# Patient Record
Sex: Male | Born: 1965
Health system: Southern US, Community
[De-identification: ages and names within clinical notes are randomized; demographics above are authoritative.]

## PROBLEM LIST (undated history)

## (undated) DIAGNOSIS — F429 Obsessive-compulsive disorder, unspecified: Secondary | ICD-10-CM

## (undated) DIAGNOSIS — R011 Cardiac murmur, unspecified: Secondary | ICD-10-CM

## (undated) HISTORY — PX: GUM SURGERY: SHX658

## (undated) HISTORY — DX: Cardiac murmur, unspecified: R01.1

## (undated) HISTORY — DX: Obsessive-compulsive disorder, unspecified: F42.9

---

## 2004-07-19 ENCOUNTER — Encounter: Payer: Self-pay | Admitting: Family Medicine

## 2008-08-13 ENCOUNTER — Encounter: Payer: Self-pay | Admitting: Family Medicine

## 2008-08-13 LAB — CONVERTED CEMR LAB
ALT: 22 units/L
Creatinine, Ser: 0.9 mg/dL

## 2009-05-10 LAB — CONVERTED CEMR LAB
Basophils Relative: 0.8 %
Eosinophils Absolute: 0.1 10*3/uL
Eosinophils Relative: 1.6 %
HCT: 42 %
Hemoglobin: 14.8 g/dL
Lymphocytes Relative: 28.7 %
MCHC: 35.3 g/dL
MCV: 83.7 fL
Monocytes Absolute: 0.5 10*3/uL
Platelets: 267 10*3/uL
RDW: 12.2 %

## 2010-02-07 ENCOUNTER — Ambulatory Visit: Payer: Self-pay | Admitting: Family Medicine

## 2010-02-07 DIAGNOSIS — R011 Cardiac murmur, unspecified: Secondary | ICD-10-CM | POA: Insufficient documentation

## 2010-02-07 DIAGNOSIS — J301 Allergic rhinitis due to pollen: Secondary | ICD-10-CM

## 2010-02-07 DIAGNOSIS — F429 Obsessive-compulsive disorder, unspecified: Secondary | ICD-10-CM

## 2010-02-07 DIAGNOSIS — Z9189 Other specified personal risk factors, not elsewhere classified: Secondary | ICD-10-CM | POA: Insufficient documentation

## 2010-02-07 DIAGNOSIS — Z87448 Personal history of other diseases of urinary system: Secondary | ICD-10-CM | POA: Insufficient documentation

## 2010-02-07 DIAGNOSIS — M129 Arthropathy, unspecified: Secondary | ICD-10-CM | POA: Insufficient documentation

## 2010-02-16 ENCOUNTER — Encounter: Payer: Self-pay | Admitting: Family Medicine

## 2010-06-08 ENCOUNTER — Encounter: Payer: Self-pay | Admitting: Family Medicine

## 2010-06-13 ENCOUNTER — Encounter: Payer: Self-pay | Admitting: Family Medicine

## 2010-07-17 NOTE — Letter (Signed)
Summary: Michael Hull @ [facility] @ [employer]   Imported By: Lanelle Bal 02/26/2010 11:55:45  _____________________________________________________________________  External Attachment:    Type:   Image     Comment:   External Document

## 2010-07-17 NOTE — Miscellaneous (Signed)
Summary: Clinic update  Clinical Lists Changes  Observations: Added new observation of HGB: 14.5 g/dL (04/54/0981 19:14) Added new observation of SGPT (ALT): 22 units/L (08/13/2008 10:45) Added new observation of CREATININE: 0.90 mg/dL (78/29/5621 30:86) Clomipramine, Serum: 111 (09/22/2008) Norclomipramine,S: 210 (09/22/2008) Total (Clo+Norclo): 321 (09/22/2008) Clomipramine, Serum: 103 (05/10/2009) Norclomipramine,s: 154 (05/10/2009) Total (Clo+Norclo): 257 (05/10/2009)

## 2010-07-17 NOTE — Assessment & Plan Note (Signed)
Summary: TRANSFER FROM [facility]LL MED/CLE   Vital Signs:  Patient profile:   45 year old male Height:      68.5 inches Weight:      192.75 pounds BMI:     28.99 Temp:     98.3 degrees F oral Pulse rate:   92 / minute Pulse rhythm:   regular BP sitting:   108 / 80  (left arm) Cuff size:   large  Vitals Entered By: Delilah Shan CMA Evonna Stoltz Dull) (February 07, 2010 2:51 PM) CC: Transfer from [place]   History of Present Illness: OCD- symptoms became more prominent in 8th grade.  Contamination, repetition, checking.  In therapy with Dr. Ledon Snare.  Has been on current meds for extended period with control of symptoms.  Continues with behavioral treatment.  No SI/HI.  Looking for a job.  Doing well overall.   Preventive Screening-Counseling & Management  Alcohol-Tobacco     Smoking Status: never  Caffeine-Diet-Exercise     Does Patient Exercise: no      Drug Use:  no.    Allergies (verified): 1)  ! Penicillin 2)  ! Ampicillin  Past History:  Family History: Last updated: 02/07/2010 Family History of Arthritis, great uncle Family History Diabetes 1st degree relative, Dad is pre-diabetic Family History Hypertension, maternal uncle Family History of Heart Disease, granddad and other blood relative  Social History: Last updated: 02/07/2010 Occupation:  Currently, house parent, looking for CNA job Education:  BS Single Never Smoked Alcohol use-no Drug use-no Regular exercise-no  Past Medical History: OBSESSIVE-COMPULSIVE DISORDERS (ICD-300.3) HEMATURIA, HX OF (ICD-V13.09) SYSTOLIC MURMUR (ZOX-096.0) HAY FEVER (ICD-477.0) CHICKENPOX, HX OF (ICD-V15.9) ARTHRITIS (ICD-716.90) FAMILY HISTORY DIABETES 1ST DEGREE RELATIVE (ICD-V18.0) [facility] July 1986 for 1 week N. [facility] July - October 1986 [facility] for 1 week of testing - January 1987   Family History: Family History of Arthritis, great uncle Family History Diabetes 1st degree  relative, Dad is pre-diabetic Family History Hypertension, maternal uncle Family History of Heart Disease, granddad and other blood relative  Social History: Reviewed history and no changes required. Occupation:  Currently, house parent, looking for CNA job Education:  BS Single Never Smoked Alcohol use-no Drug use-no Regular exercise-no Occupation:  employed Smoking Status:  never Drug Use:  no Does Patient Exercise:  no  Review of Systems       See HPI.  Otherwise negative.    Physical Exam  General:  GEN: nad, alert and oriented HEENT: mucous membranes moist NECK: supple w/o LA CV: rrr.  no murmur PULM: ctab, no inc wob ABD: soft, +bs EXT: no edema SKIN: no acute rash    Impression & Recommendations:  Problem # 1:  OBSESSIVE-COMPULSIVE DISORDERS (ICD-300.3) Hard copy written for anafranil level, CBC, Cr, ALT; dx V58.53. No change in meds. Check labs in 12/11.  Fu in the spring.  continue with therapy with Dr. Ledon Snare.  Overall appears to be doing well.    Complete Medication List: 1)  Clomipramine Hcl 50 Mg Caps (Clomipramine hcl) .... Take 2 capsules by mouth two times a day 2)  Effexor Xr 75 Mg Xr24h-cap (Venlafaxine hcl) .... Take 1 capsule by mouth once a day  Patient Instructions: 1)  Keep your appointment with Dr. Ledon Snare in 02/2010.  I want you to have your labs checked again in 05/2010.  I would like to see you back in the spring of 2012. Let me know if you have questions or concerns in  the meantime.   Prescriptions: CLOMIPRAMINE HCL 50 MG CAPS (CLOMIPRAMINE HCL) Take 2 capsules by mouth two times a day  #360 x 3   Entered and Authorized by:   Crawford Givens MD   Signed by:   Crawford Givens MD on 02/07/2010   Method used:   Print then Give to Patient   RxID:   [id number]   Current Allergies (reviewed today): ! PENICILLIN ! AMPICILLIN

## 2010-07-19 LAB — CONVERTED CEMR LAB
ALT: 25 units/L
HCT: 39.8 %
Hemoglobin: 13.7 g/dL

## 2010-07-19 NOTE — Miscellaneous (Signed)
  Clinical Lists Changes  Observations: Added new observation of HCT: 39.8 % (06/08/2010 17:13) Added new observation of HGB: 13.7 g/dL (16/03/9603 54:09) Added new observation of RBC M/UL: 4.83 M/uL (06/08/2010 17:13) Added new observation of WBC COUNT: 5.4 10*3/microliter (06/08/2010 17:13) Added new observation of SGPT (ALT): 25 units/L (06/08/2010 17:13) Added new observation of CREATININE: 0.87 mg/dL (81/19/1478 29:56)

## 2010-08-06 ENCOUNTER — Encounter: Payer: Self-pay | Admitting: Family Medicine

## 2010-08-06 ENCOUNTER — Ambulatory Visit (INDEPENDENT_AMBULATORY_CARE_PROVIDER_SITE_OTHER): Payer: Self-pay | Admitting: Family Medicine

## 2010-08-06 DIAGNOSIS — F429 Obsessive-compulsive disorder, unspecified: Secondary | ICD-10-CM

## 2010-08-14 NOTE — Assessment & Plan Note (Signed)
Summary: REFILL MEDICATION/CLE   NO INSURANCE   Vital Signs:  Patient profile:   45 year old male Height:      68.5 inches Weight:      191 pounds BMI:     28.72 Temp:     97.7 degrees F oral Pulse rate:   80 / minute Pulse rhythm:   regular BP sitting:   120 / 78  (left arm) Cuff size:   large  Vitals Entered By: Delilah Shan CMA Michael Hull) (August 06, 2010 9:33 AM) CC: Refill medication   CC:  Refill medication.  History of Present Illness: Continues to work thought OCD thoughts.  No SI/HI.  Contracts for safety.  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  Still in counseling.  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  Overall, mood is controlled/good and 'middle of the road.'  Compliant with meds.    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  Sleeping well.  Some exercise, walking a lot at work.  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    Needs form done for effexor for med assistance.  We did these and gave originals back to the patient.   Allergies: 1)  ! Penicillin 2)  ! Ampicillin  Past History:  Past Medical History: Last updated: 02/07/2010 OBSESSIVE-COMPULSIVE DISORDERS (ICD-300.3) HEMATURIA, HX OF (ICD-V13.09) SYSTOLIC MURMUR (ZOX-096.0) HAY FEVER (ICD-477.0) CHICKENPOX, HX OF (ICD-V15.9) ARTHRITIS (ICD-716.90) FAMILY HISTORY DIABETES 1ST DEGREE RELATIVE (ICD-V18.0) [facility] July 1986 for 1 week N. [facility] July - October 1986 [facility] for 1 week of testing - January 1987   Review of Systems       See HPI.  Otherwise negative.    Physical Exam  General:  GEN: nad, alert and oriented HEENT: mucous membranes moist NECK: supple w/o LA CV: rrr.  no murmur PULM: ctab, no inc wob ABD: soft, +bs EXT: no edema SKIN: no acute rash    Impression & Recommendations:  Problem # 1:  OBSESSIVE-COMPULSIVE DISORDERS (ICD-300.3) No change in meds.  Controlled, continue with counseling.   Due to brevity of encounter, level 2 charged with 6 month follow up planned.    Complete Medication List: 1)  Clomipramine Hcl 50 Mg Caps (Clomipramine hcl) .... Take 2 capsules by mouth two times a day 2)  Effexor Xr 75 Mg Xr24h-cap (Venlafaxine hcl) .... Take 1 capsule by mouth once a day 3)  Multivitamins Tabs (Multiple vitamin) .... Take 1 tablet by mouth once a day  Patient Instructions: 1)  I would like to see you back in 6 months.  Let me know if you have concerns in the meantime.  Take care.  Prescriptions: EFFEXOR XR 75 MG XR24H-CAP (VENLAFAXINE HCL) Take 1 capsule by mouth once a day  #90 x 3   Entered and Authorized by:   Michael Givens MD   Signed by:   Michael Givens MD on 08/06/2010   Method used:   Print then Give to Patient   RxID:   [id number] EFFEXOR XR 75 MG XR24H-CAP (VENLAFAXINE HCL) Take 1 capsule by mouth once a day  #90 x 3   Entered and Authorized by:   Michael Givens MD   Signed by:   Michael Givens MD on 08/06/2010   Method used:   Print then Give to Patient   RxID:   [id number]    Orders Added: 1)  Est. Patient Level II [52841]  Current Allergies (reviewed today): ! PENICILLIN ! AMPICILLIN

## 2010-08-23 NOTE — Medication Information (Signed)
Summary: Effexor/[employer]  Effexor/[employer]   Imported By: Sherian Rein 08/13/2010 09:40:16  _____________________________________________________________________  External Attachment:    Type:   Image     Comment:   External Document

## 2010-09-20 ENCOUNTER — Telehealth: Payer: Self-pay | Admitting: *Deleted

## 2010-09-20 ENCOUNTER — Other Ambulatory Visit: Payer: Self-pay | Admitting: *Deleted

## 2010-09-20 NOTE — Telephone Encounter (Signed)
Created in error

## 2010-09-20 NOTE — Telephone Encounter (Signed)
Effexor XR received from [employer] patient assistance.  Lot number [id number], exp 02/2013.  Advised pt, he will pick up.

## 2010-12-12 ENCOUNTER — Other Ambulatory Visit: Payer: Self-pay | Admitting: *Deleted

## 2010-12-12 NOTE — Telephone Encounter (Signed)
Reorder for pt's effexor called to [facility], order number [id number].

## 2010-12-24 ENCOUNTER — Telehealth: Payer: Self-pay | Admitting: *Deleted

## 2010-12-24 NOTE — Telephone Encounter (Signed)
Pt's Effexor has arrived from [employer], left message on pt's voice mail asking him to call so that I can advise him.  One bottle of number 90, lot number [id number], expires 04/2013.

## 2010-12-25 NOTE — Telephone Encounter (Signed)
Pt was advised that effexor is here, he will pick up later in the week.

## 2011-01-14 ENCOUNTER — Encounter: Payer: Self-pay | Admitting: Family Medicine

## 2011-01-17 ENCOUNTER — Ambulatory Visit (INDEPENDENT_AMBULATORY_CARE_PROVIDER_SITE_OTHER): Payer: Self-pay | Admitting: Family Medicine

## 2011-01-17 ENCOUNTER — Encounter: Payer: Self-pay | Admitting: Family Medicine

## 2011-01-17 DIAGNOSIS — F429 Obsessive-compulsive disorder, unspecified: Secondary | ICD-10-CM

## 2011-01-17 MED ORDER — CLOMIPRAMINE HCL 50 MG PO CAPS: 100.0000 mg | ORAL_CAPSULE | Freq: Two times a day (BID) | ORAL | Status: DC

## 2011-01-17 NOTE — Patient Instructions (Signed)
Don't change your meds.  Take care.  Call me if you have concerns.  I want to see you back in 6 months.  OV.

## 2011-01-17 NOTE — Progress Notes (Signed)
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  Last eval was good.  "They said I was a little slow, but I do a good job."  He is busy with his work.    Mood has been "okay."  "In the middle."  Obsessions are occ present, and the compulsions can slow him down, but he is managing.  "I would work faster w/o the compulsions [checking]."  He checks the trash to make sure he "isn't throwing away something important."  He is meticulous with vitals.  Sleeping well.  No SI/HI.  He contracts for safety.    He's still seeing Dr. Ledon Snare for counseling.  They have a list of goals to work through.  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    Meds, vitals, and allergies reviewed.   ROS: See HPI.  Otherwise, noncontributory.  Faint tremor.   nad ncat rrr ctab abd soft, ext w/o edema Speech and judgement intact

## 2011-01-17 NOTE — Assessment & Plan Note (Signed)
Controlled, continue current meds.  Okay for outpatient f/u.  Continue with counseling.

## 2011-01-23 ENCOUNTER — Telehealth: Payer: Self-pay | Admitting: Family Medicine

## 2011-01-23 NOTE — Telephone Encounter (Signed)
I called Dr. Jennette Banker clinic about the patient, just to check on pt's care plan.  I LMOVM, will await return call.

## 2011-01-24 ENCOUNTER — Telehealth: Payer: Self-pay | Admitting: *Deleted

## 2011-01-24 NOTE — Telephone Encounter (Signed)
Noted. Thanks.

## 2011-01-24 NOTE — Telephone Encounter (Signed)
Dr. Ledon Snare called to let you know that he agrees with your opinion on patient- says he is steady, stable, good.

## 2011-03-18 ENCOUNTER — Telehealth: Payer: Self-pay | Admitting: *Deleted

## 2011-03-18 NOTE — Telephone Encounter (Signed)
Refill order for effexor called to [employer], order number [id number].

## 2011-03-25 ENCOUNTER — Telehealth: Payer: Self-pay | Admitting: *Deleted

## 2011-03-25 NOTE — Telephone Encounter (Signed)
Effexor received from [employer], one bottle of # 90.  Lot [id number], expires 05/2013. Left message advising pt ok to pick up.

## 2011-06-14 ENCOUNTER — Other Ambulatory Visit: Payer: Self-pay | Admitting: Internal Medicine

## 2011-06-14 NOTE — Telephone Encounter (Signed)
[employer] and reordered his Effexor.  Order nu# is [id number]

## 2011-07-22 ENCOUNTER — Encounter: Payer: Self-pay | Admitting: Family Medicine

## 2011-07-22 ENCOUNTER — Ambulatory Visit (INDEPENDENT_AMBULATORY_CARE_PROVIDER_SITE_OTHER): Payer: Self-pay | Admitting: Family Medicine

## 2011-07-22 DIAGNOSIS — Z8349 Family history of other endocrine, nutritional and metabolic diseases: Secondary | ICD-10-CM

## 2011-07-22 DIAGNOSIS — Z13220 Encounter for screening for lipoid disorders: Secondary | ICD-10-CM | POA: Insufficient documentation

## 2011-07-22 DIAGNOSIS — F429 Obsessive-compulsive disorder, unspecified: Secondary | ICD-10-CM

## 2011-07-22 DIAGNOSIS — Z79899 Other long term (current) drug therapy: Secondary | ICD-10-CM | POA: Insufficient documentation

## 2011-07-22 NOTE — Patient Instructions (Addendum)
Send me the form about the effexor and I'll fill it out.   Get fasting labs done.  Take care.  Recheck in 6 months.

## 2011-07-22 NOTE — Progress Notes (Signed)
OCD.  There was a norovirus outbreak at his work, but he isn't sick from that. 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  Still active in church.  He isn't taking classes this semester.  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  He looks forward to tutoring.  He is exercising, but "not as much as I should be."  He did a 5K in October.    Affect is bright today.  Mood is good. "I can get discouraged some, but generally okay."  He hasn't seen Dr. Ledon Snare recently, but is planning on following up at [facility] eventually.  He'll see Dr. Ledon Snare if he can't get in at [facility] soon.    His obsessions- cleanliness- and compulsions- checking- continues but this is manageable.  No Si/Hi.  He contracts for safety.  Compliant with meds.    Screening for lipid disorders, due for labs.    FH thyroid disease, never screened.  No mass in neck.    PMH and SH reviewed  ROS: See HPI, otherwise noncontributory.  Meds, vitals, and allergies reviewed.    nad ncat Mmm No neck mass rrr ctab abd soft, not ttp Ext w/o edema Speech and judgement wnl, affect bright

## 2011-07-23 NOTE — Assessment & Plan Note (Signed)
Order for lipid panel written

## 2011-07-23 NOTE — Assessment & Plan Note (Signed)
Continue current meds, order for anafranil level, ALT written and he'll get drawn.  Okay for outpatient f/u.  He'll f/u with counseling and/or [facility] clinic.

## 2011-07-23 NOTE — Assessment & Plan Note (Signed)
Order for [facility] written.  He'll get drawn.

## 2011-08-29 ENCOUNTER — Other Ambulatory Visit: Payer: Self-pay | Admitting: *Deleted

## 2011-08-29 NOTE — Telephone Encounter (Signed)
Called [employer] [facility] at [phone] Pt should received medication in 7-10 days  effexor xr 75 mg Order number is [id number]

## 2011-09-03 ENCOUNTER — Telehealth: Payer: Self-pay | Admitting: *Deleted

## 2011-09-03 NOTE — Telephone Encounter (Signed)
Patient called and wanted to know when is he suppose to take the last dose of Clomipramine?  12 hours before blood draw or when?  Please advise.

## 2011-09-03 NOTE — Telephone Encounter (Signed)
12 hours before the draw.  If he has an AM lab draw, I would just take the AM dose after sample collection.

## 2011-09-03 NOTE — Telephone Encounter (Signed)
Patient advised.

## 2011-09-04 ENCOUNTER — Telehealth: Payer: Self-pay | Admitting: *Deleted

## 2011-09-04 NOTE — Telephone Encounter (Signed)
Pt wants an order to have comipramine level checked.  He wants the order faxed to [facility] at fax number [phone], and he would like to be notified when this is faxed.

## 2011-09-04 NOTE — Telephone Encounter (Signed)
Please fax.  Hard copy is in my office.  Notify pt.

## 2011-09-05 NOTE — Telephone Encounter (Signed)
Order faxed as requested  Patient notified

## 2011-09-11 ENCOUNTER — Encounter: Payer: Self-pay | Admitting: Family Medicine

## 2011-09-18 ENCOUNTER — Other Ambulatory Visit: Payer: Self-pay

## 2011-09-18 NOTE — Telephone Encounter (Signed)
Pt came by office and left message that he checked website [employer] for prescription assistance and pt is no longer eligible for free Effexor.  Pt said when need refill in June he will call to get new rx for Venlafaxine. Spoke with pt and advised when about 1-2 weeks before need rx to request prescription. Pt confirmed that.

## 2011-11-05 ENCOUNTER — Telehealth: Payer: Self-pay

## 2011-11-05 NOTE — Telephone Encounter (Signed)
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 Pt wanted to know if should get Immunizations including MMR prior to coming to office and bring immunization record. I told pt he does need to bring school form and any mmunization records he has. If pt needs MMR would need to get at health dept because we do not have adult MMR vaccination in our office. Pt acknowledged understanding.

## 2011-12-02 ENCOUNTER — Encounter: Payer: Self-pay | Admitting: Family Medicine

## 2011-12-02 ENCOUNTER — Ambulatory Visit (INDEPENDENT_AMBULATORY_CARE_PROVIDER_SITE_OTHER): Payer: Self-pay | Admitting: Family Medicine

## 2011-12-02 VITALS — BP 118/72 | HR 91 | Temp 98.5°F | Ht 68.5 in | Wt 195.0 lb

## 2011-12-02 DIAGNOSIS — R7989 Other specified abnormal findings of blood chemistry: Secondary | ICD-10-CM

## 2011-12-02 DIAGNOSIS — IMO0001 Reserved for inherently not codable concepts without codable children: Secondary | ICD-10-CM

## 2011-12-02 DIAGNOSIS — Z8349 Family history of other endocrine, nutritional and metabolic diseases: Secondary | ICD-10-CM

## 2011-12-02 DIAGNOSIS — F429 Obsessive-compulsive disorder, unspecified: Secondary | ICD-10-CM

## 2011-12-02 DIAGNOSIS — R61 Generalized hyperhidrosis: Secondary | ICD-10-CM

## 2011-12-02 DIAGNOSIS — Z23 Encounter for immunization: Secondary | ICD-10-CM

## 2011-12-02 DIAGNOSIS — Z8619 Personal history of other infectious and parasitic diseases: Secondary | ICD-10-CM

## 2011-12-02 DIAGNOSIS — R6889 Other general symptoms and signs: Secondary | ICD-10-CM

## 2011-12-02 DIAGNOSIS — Z029 Encounter for administrative examinations, unspecified: Secondary | ICD-10-CM

## 2011-12-02 LAB — TSH: TSH: 5.21 u[IU]/mL (ref 0.35–5.50)

## 2011-12-02 MED ORDER — VENLAFAXINE HCL ER 75 MG PO CP24: 75.0000 mg | ORAL_CAPSULE | Freq: Every day | ORAL | Status: DC

## 2011-12-02 NOTE — Progress Notes (Signed)
Needs physical form done for school.  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  See scanned forms.   Noted history includes OCD.  Treated with effect and doing well in terms of daily function and symptom control.  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  No Si/Hi.  No recent med changes.  Compliant with meds.    He does have excessive sweating on back and chest.  Can soak through his shirt.  No a new symptom.  Has cut out caffeine.  It is worse on a stressful day.  Asking about options.    H/o mildly abnormal TSH and due for recheck.  T3/T4 wnl prev.  No goiter.    Meds, vitals, and allergies reviewed.   ROS: See HPI.  Otherwise, noncontributory.  nad Affect wnl, speech wnl and fluent.  Judgement intact Mmm rrr ctab Abd soft, not ttp Ext w/o edema See scanned forms.

## 2011-12-02 NOTE — Patient Instructions (Addendum)
Go to the lab on the way out.  We'll contact you with your lab report. Come back in 48-72 hours for a PPD check.  I'll finish your forms at that point.   I would get dry-sol over the counter and see if that helps.   Take care.

## 2011-12-03 ENCOUNTER — Encounter: Payer: Self-pay | Admitting: Family Medicine

## 2011-12-03 ENCOUNTER — Encounter: Payer: Self-pay | Admitting: *Deleted

## 2011-12-03 DIAGNOSIS — R61 Generalized hyperhidrosis: Secondary | ICD-10-CM | POA: Insufficient documentation

## 2011-12-03 DIAGNOSIS — Z029 Encounter for administrative examinations, unspecified: Secondary | ICD-10-CM | POA: Insufficient documentation

## 2011-12-03 LAB — VARICELLA ZOSTER ANTIBODY, IGG: Varicella IgG: 5.03 {ISR} — ABNORMAL HIGH

## 2011-12-03 NOTE — Assessment & Plan Note (Signed)
With minimal elevation in TSH.  No TMG on exam, this could be rechecked episodically.

## 2011-12-03 NOTE — Assessment & Plan Note (Signed)
With good med adherence, no Si/Hi, and sx controlled.  He is looking forward to school and it appears that he has no contraindication to applying and proceeding.  See scanned forms.

## 2011-12-03 NOTE — Assessment & Plan Note (Signed)
Needed PPD placed, will return for reading.  Due for meningococcal and Tdap today, done.  Vision and hear screen passed and varicella titer drawn.  See scanned forms.

## 2011-12-03 NOTE — Assessment & Plan Note (Signed)
He can use drysol topically and f/u prn.

## 2011-12-04 ENCOUNTER — Telehealth: Payer: Self-pay

## 2011-12-04 ENCOUNTER — Ambulatory Visit: Payer: Self-pay

## 2011-12-04 LAB — TB SKIN TEST: TB Skin Test: NEGATIVE

## 2011-12-04 MED ORDER — ALUMINUM CHLORIDE 20 % EX SOLN: Freq: Every day | CUTANEOUS | Status: DC

## 2011-12-04 NOTE — Telephone Encounter (Signed)
Pt said Drysol is not OTC and request rx sent to [facility] [address].Please advise.

## 2011-12-04 NOTE — Telephone Encounter (Signed)
Sent!

## 2011-12-06 ENCOUNTER — Encounter: Payer: Self-pay | Admitting: *Deleted

## 2012-03-17 ENCOUNTER — Other Ambulatory Visit: Payer: Self-pay | Admitting: Family Medicine

## 2012-03-17 NOTE — Telephone Encounter (Signed)
Electronic refill request.  Please advise. 

## 2012-03-18 NOTE — Telephone Encounter (Signed)
Sent!

## 2012-03-28 ENCOUNTER — Other Ambulatory Visit: Payer: Self-pay | Admitting: Family Medicine

## 2012-03-30 ENCOUNTER — Other Ambulatory Visit: Payer: Self-pay

## 2012-03-30 NOTE — Telephone Encounter (Signed)
Electronic refill request

## 2012-03-30 NOTE — Telephone Encounter (Signed)
Pt left v/m requesting refill clomipramine to [facility] [place].Please advise.

## 2012-03-31 MED ORDER — CLOMIPRAMINE HCL 50 MG PO CAPS: 100.0000 mg | ORAL_CAPSULE | Freq: Two times a day (BID) | ORAL | Status: DC

## 2012-03-31 NOTE — Telephone Encounter (Signed)
Sent!

## 2012-04-02 ENCOUNTER — Other Ambulatory Visit: Payer: Self-pay | Admitting: *Deleted

## 2012-04-02 NOTE — Telephone Encounter (Signed)
Verify the clomipramine cost with pharmacy.  This generic should not be anywhere near that expensive.  Thanks.

## 2012-04-02 NOTE — Telephone Encounter (Signed)
Pharmacy verified the cost.

## 2012-04-02 NOTE — Telephone Encounter (Signed)
Pt called back; clomipramine cost $4000 for 3 month supply and with savings card 3 month cost $2790. Too expensive and pt request increase in Venlafaxine.Please advise. [facility] [address] [place].

## 2012-04-02 NOTE — Telephone Encounter (Signed)
Pt came in stating his rx (clomipramine) was not called in to pharmacy. Rx was approved on 10/14/213 and I called it in.

## 2012-04-03 NOTE — Telephone Encounter (Signed)
Notify pt. I'll need to d/w pharmacy about alternatives.  I'll let him know about options as soon as I can.

## 2012-04-03 NOTE — Telephone Encounter (Signed)
I phoned [facility] to get a price check and there wasn't much difference... but some, $3,219 for a 3 month supply without insurance.  If his insurance pays the same amount as it did on the $4000 tab, it would turn out to be about $1210 for a 3 month supply.  I don't think it will be any cheaper anywhere than [facility].

## 2012-04-06 MED ORDER — CLOMIPRAMINE HCL 50 MG PO CAPS: 100.0000 mg | ORAL_CAPSULE | Freq: Two times a day (BID) | ORAL | Status: DC

## 2012-04-06 NOTE — Telephone Encounter (Signed)
LMOVM in detail.  Faxed Rx to number provided below.

## 2012-04-06 NOTE — Telephone Encounter (Signed)
I called pt.  He has no SI/HI.  He has been off clomipramine for about 1 week.  He has no w/d symptoms.  Mood is good.  He has mild inc in OCD symptoms, but no SI/HI.  At this point, would inc the venlafaxine to 150 mg a day until clomipramine rx arrives in a few days (hopefully delivery in about 5-7 business days).  At that point, cut back to venlafaxine 75mg .  Slowly restart clomipramine, 50mg /day for 1 week, 100mg /day for 1 week,  150mg /day for 1 week, 200mg /day for 1 week.  He agrees and will notify me if mood/OCD changes.  Contracts for safety. He'll notify me before he runs out of medicine next time.

## 2012-04-06 NOTE — Telephone Encounter (Addendum)
Pt left v/m requesting new prescription for  # 300 or # 400 for clomipramine 50 mg taking 2 tabs in AM and 2 tabs in PM. Pt request rx faxed to [facility] out of [place] and [place]; fax #[phone] or phone # [phone]. Order # [id number]. Pt also request increase in Venlafaxine from 75 mg daily to 150 mg daily. Pt said if does not hear from someone by 24 hours pt will assume OK to increase Venlafaxine. Please call pt back ASAP.Please advise.

## 2012-04-06 NOTE — Telephone Encounter (Signed)
Pt left v/m that [facility] did get rx for clomipramine but will take 2-3 weeks for pt to receive med and wants to know what should do while waiting on clomipramine. Pt understood he is not to increase venlafaxine. Pt has never taken Paxil.Please advise.

## 2012-04-06 NOTE — Addendum Note (Signed)
Addended by: Joaquim Nam on: 04/06/2012 09:40 AM   Modules accepted: Orders

## 2012-04-06 NOTE — Telephone Encounter (Signed)
I had checked with pharmacy about this over the weekend.  Would be reasonable to change to paxil if he can't get the clomipramine.  I wouldn't change the venlafaxine at this point.  I printed the clomipramine rx; please fax this in.  If he can get this, then I wouldn't change any of his meds.  Thanks.

## 2012-11-04 ENCOUNTER — Telehealth: Payer: Self-pay

## 2012-11-04 NOTE — Telephone Encounter (Signed)
Needs f/u visit this summer, no labs ahead of time.

## 2012-11-04 NOTE — Telephone Encounter (Signed)
Pt left v/m usually has appt to see Dr Para March q 6 months. Last seen 11/2011. When does Dr Para March want to see pt and does pt needs labs prior to appt.Please advise.

## 2012-11-05 NOTE — Telephone Encounter (Signed)
Left detailed message on voicemail.  

## 2013-01-21 ENCOUNTER — Encounter: Payer: Self-pay | Admitting: Family Medicine

## 2013-01-21 ENCOUNTER — Ambulatory Visit (INDEPENDENT_AMBULATORY_CARE_PROVIDER_SITE_OTHER): Payer: BC Managed Care – PPO | Admitting: Family Medicine

## 2013-01-21 VITALS — BP 132/84 | HR 92 | Temp 98.1°F | Wt 195.2 lb

## 2013-01-21 DIAGNOSIS — F429 Obsessive-compulsive disorder, unspecified: Secondary | ICD-10-CM

## 2013-01-21 MED ORDER — CLOMIPRAMINE HCL 50 MG PO CAPS: 100.0000 mg | ORAL_CAPSULE | Freq: Two times a day (BID) | ORAL | Status: DC

## 2013-01-21 MED ORDER — ALUMINUM CHLORIDE 20 % EX SOLN: CUTANEOUS | Status: DC

## 2013-01-21 MED ORDER — VENLAFAXINE HCL ER 75 MG PO CP24: ORAL_CAPSULE | ORAL | Status: DC

## 2013-01-21 NOTE — Patient Instructions (Signed)
Go to the lab on the way out.  We'll contact you with your lab report.  Take care.  Recheck in about 6-8 months.

## 2013-01-21 NOTE — Progress Notes (Signed)
He was able to get clomipramine and is back on that now in addition to the venlafaxine.  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  He is looking for a job now.  He has been working as a Firefighter in the meantime.  Still living in [place], renting a room.  Still with frequent caffeine.  Discussed his mood, "okay, middle of the road."  He thinks the anxiety improved after getting out of the nursing program, "it was a burden lifted off."  He is going to see Dr. Ledon Snare with counseling.  He is trying to get closer to home, ie in the [facility] clinic.  He has a list of options from [facility].  He is sleeping a little more than typical, he is slightly less motivated.  He doesn't give a history of depressive sx.  No SI/HI.  He contracts for safety.  D/w pt about rituals, ie laundry.  He is still trying to work through his goals, prev d/w Dr. Ledon Snare.  This helps with compulsions.   Drysol helps with excessive sweating.    Meds, vitals, and allergies reviewed.   ROS: See HPI.  Otherwise, noncontributory.  GEN: nad, alert and oriented HEENT: mucous membranes moist NECK: supple w/o LA CV: rrr PULM: ctab, no inc wob ABD: soft, +bs EXT: no edema SKIN: no acute rash Speech and judgement wnl, no tremor

## 2013-01-22 NOTE — Assessment & Plan Note (Signed)
Mood stable, continue current meds. Check clomipramine level today.  He agrees. No changes in in meds o/w. He'll continue with counseling and goal directed behavior. Okay for outpatient f/u.

## 2013-02-03 ENCOUNTER — Encounter: Payer: Self-pay | Admitting: Family Medicine

## 2013-04-22 ENCOUNTER — Other Ambulatory Visit: Payer: Self-pay

## 2013-10-21 ENCOUNTER — Encounter: Payer: Self-pay | Admitting: Family Medicine

## 2013-10-21 ENCOUNTER — Ambulatory Visit (INDEPENDENT_AMBULATORY_CARE_PROVIDER_SITE_OTHER): Payer: BC Managed Care – PPO | Admitting: Family Medicine

## 2013-10-21 VITALS — BP 124/72 | HR 82 | Temp 98.0°F | Ht 68.0 in | Wt 196.0 lb

## 2013-10-21 DIAGNOSIS — Z8349 Family history of other endocrine, nutritional and metabolic diseases: Secondary | ICD-10-CM

## 2013-10-21 DIAGNOSIS — Z0000 Encounter for general adult medical examination without abnormal findings: Secondary | ICD-10-CM

## 2013-10-21 DIAGNOSIS — E7800 Pure hypercholesterolemia, unspecified: Secondary | ICD-10-CM

## 2013-10-21 DIAGNOSIS — F429 Obsessive-compulsive disorder, unspecified: Secondary | ICD-10-CM

## 2013-10-21 DIAGNOSIS — M549 Dorsalgia, unspecified: Secondary | ICD-10-CM

## 2013-10-21 MED ORDER — CLOMIPRAMINE HCL 50 MG PO CAPS: 100.0000 mg | ORAL_CAPSULE | Freq: Two times a day (BID) | ORAL | Status: DC

## 2013-10-21 MED ORDER — ALUMINUM CHLORIDE 20 % EX SOLN: CUTANEOUS | Status: DC

## 2013-10-21 MED ORDER — VENLAFAXINE HCL ER 75 MG PO CP24: ORAL_CAPSULE | ORAL | Status: DC

## 2013-10-21 NOTE — Progress Notes (Signed)
Pre visit review using our clinic review tool, if applicable. No additional management support is needed unless otherwise documented below in the visit note.  CPE- See plan.  Routine anticipatory guidance given to patient.  See health maintenance. Tetanus 2013 Flu shot done at work Prostate and colon cancer screening not due.  Diet and exercise d/w pt.  He is trying to exercise more.  Diet is fair.  D/w pt.   Living will.  Would have his sister designated if patient were incapacitated.    FH thyroid disease.  Prev with TSH wnl.  No neck mass or lumps.  No dysphagia.    Some back pain with lifting.  R lower back.  No rash.  No trauma. No leg sx.   OCD.  D/w pt.  Compliant with meds.  Still focused on being thorough at work which can potentially be helpful for the patients individually but he isn't able to push through quickly.  We talked about making this manageable, coping strategies. He sees a clear benefit from the meds, no ADE.  Mood is "in the middle."  No SI/HI.    PMH and SH reviewed  Meds, vitals, and allergies reviewed.   ROS: See HPI.  Otherwise negative.    GEN: nad, alert and oriented HEENT: mucous membranes moist NECK: supple w/o LA, no TMG CV: rrr. PULM: ctab, no inc wob ABD: soft, +bs EXT: no edema SKIN: no acute rash Mood and speech appear wnl R lower back ttp but not ttp in midline.  Gait wnl.

## 2013-10-21 NOTE — Patient Instructions (Addendum)
Go to the lab on the way out.  We'll contact you with your lab report. I would get a flu shot each fall.   Stretch your lower back and take care. Glad to see you.

## 2013-10-22 DIAGNOSIS — Z0000 Encounter for general adult medical examination without abnormal findings: Secondary | ICD-10-CM | POA: Insufficient documentation

## 2013-10-22 DIAGNOSIS — M549 Dorsalgia, unspecified: Secondary | ICD-10-CM | POA: Insufficient documentation

## 2013-10-22 LAB — COMPREHENSIVE METABOLIC PANEL
ALT: 18 U/L (ref 0–53)
AST: 26 U/L (ref 0–37)
Albumin: 4.4 g/dL (ref 3.5–5.2)
Alkaline Phosphatase: 89 U/L (ref 39–117)
BUN: 15 mg/dL (ref 6–23)
CALCIUM: 9.2 mg/dL (ref 8.4–10.5)
CHLORIDE: 104 meq/L (ref 96–112)
CO2: 27 meq/L (ref 19–32)
Creatinine, Ser: 0.8 mg/dL (ref 0.4–1.5)
GFR: 106.19 mL/min (ref >60.00)
GLUCOSE: 79 mg/dL (ref 70–99)
POTASSIUM: 4.2 meq/L (ref 3.5–5.1)
Sodium: 137 mEq/L (ref 135–145)
Total Bilirubin: 0.7 mg/dL (ref 0.2–1.2)
Total Protein: 7.3 g/dL (ref 6.0–8.3)

## 2013-10-22 LAB — LIPID PANEL
CHOL/HDL RATIO: 5
CHOLESTEROL: 191 mg/dL (ref 0–200)
HDL: 40.6 mg/dL (ref >39.00)
LDL Cholesterol: 127 mg/dL — ABNORMAL HIGH (ref 0–99)
TRIGLYCERIDES: 115 mg/dL (ref 0.0–149.0)
VLDL: 23 mg/dL (ref 0.0–40.0)

## 2013-10-22 LAB — TSH: TSH: 1.85 u[IU]/mL (ref 0.35–4.50)

## 2013-10-22 NOTE — Assessment & Plan Note (Signed)
Continue as is with current meds.  Doing well overall, ie compensated.  No SI/HI.  Okay for outpatient f/u.

## 2013-10-22 NOTE — Assessment & Plan Note (Signed)
Likely strain d/w pt about posture for lifting and stretching.  Fu prn.

## 2013-10-22 NOTE — Assessment & Plan Note (Signed)
Check TSH today

## 2013-10-22 NOTE — Assessment & Plan Note (Signed)
Routine anticipatory guidance given to patient. See health maintenance.  Tetanus 2013  Flu shot done at work  Prostate and colon cancer screening not due.  Diet and exercise d/w pt. He is trying to exercise more. Diet is fair. D/w pt.  Living will. Would have his sister designated if patient were incapacitated.

## 2013-11-05 ENCOUNTER — Encounter: Payer: Self-pay | Admitting: *Deleted

## 2014-06-02 ENCOUNTER — Other Ambulatory Visit: Payer: Self-pay | Admitting: Family Medicine

## 2014-08-29 ENCOUNTER — Other Ambulatory Visit: Payer: Self-pay | Admitting: *Deleted

## 2014-08-29 MED ORDER — ALUMINUM CHLORIDE 20 % EX SOLN: CUTANEOUS | Status: DC

## 2014-08-29 MED ORDER — VENLAFAXINE HCL ER 75 MG PO CP24: 75.0000 mg | ORAL_CAPSULE | Freq: Every day | ORAL | Status: DC

## 2014-08-29 NOTE — Telephone Encounter (Signed)
Faxed refill request.  Patient is due for CPE in May.  No appt scheduled.  Please advise how many refills with reminder to schedule CPE. Drysol Last Filled:    60 mL 12 10/21/2013  Venlafaxine Last Filled:    90 capsule 1 06/02/2014

## 2014-08-29 NOTE — Telephone Encounter (Signed)
Sent.  Please schedule CPE.  Thanks.  

## 2014-08-30 NOTE — Telephone Encounter (Signed)
Patient advised.

## 2014-11-08 ENCOUNTER — Encounter: Payer: Self-pay | Admitting: Family Medicine

## 2014-11-08 ENCOUNTER — Other Ambulatory Visit: Payer: Self-pay | Admitting: Family Medicine

## 2014-11-08 ENCOUNTER — Ambulatory Visit (INDEPENDENT_AMBULATORY_CARE_PROVIDER_SITE_OTHER): Payer: 59 | Admitting: Family Medicine

## 2014-11-08 VITALS — BP 114/74 | HR 82 | Temp 98.2°F | Ht 67.5 in | Wt 194.5 lb

## 2014-11-08 DIAGNOSIS — Z0000 Encounter for general adult medical examination without abnormal findings: Secondary | ICD-10-CM

## 2014-11-08 DIAGNOSIS — F429 Obsessive-compulsive disorder, unspecified: Secondary | ICD-10-CM

## 2014-11-08 DIAGNOSIS — Z7189 Other specified counseling: Secondary | ICD-10-CM

## 2014-11-08 DIAGNOSIS — F42 Obsessive-compulsive disorder: Secondary | ICD-10-CM | POA: Diagnosis not present

## 2014-11-08 NOTE — Patient Instructions (Signed)
Go to the lab on the way out.  We'll contact you with your lab report. Let us know about 3 weeks before you need a prescription.   We can fax out the order.  Let us know where it need to go.

## 2014-11-08 NOTE — Progress Notes (Signed)
Pre visit review using our clinic review tool, if applicable. No additional management support is needed unless otherwise documented below in the visit note.  CPE- See plan.  Routine anticipatory guidance given to patient.  See health maintenance. Tetanus 2013 Flu shot done at work usually, encouraged.   Prostate and colon cancer screening not due.  Diet and exercise d/w pt. He is trying to exercise more. Diet is fair. D/w pt.  Encouraged both.   Living will. Would have his sister designated if patient were incapacitated.   OCD. D/w pt. Compliant with meds. Still focused on being thorough at work which can potentially be helpful for the patients individually but he isn't able to push through quickly. "I have a reputation for being slow, but that is partly from being conscientious.  We talked about making this manageable, coping strategies.  He sees a clear benefit from the meds, no ADE. Mood is "in the middle of the road." No SI/HI. He isn't in therapy now, it helped him some previous.  I encouraged counseling.  He has job application to go in soon.    PMH and SH reviewed  Meds, vitals, and allergies reviewed.   ROS: See HPI.  Otherwise negative.    GEN: nad, alert and oriented HEENT: mucous membranes moist NECK: supple w/o LA CV: rrr. PULM: ctab, no inc wob ABD: soft, +bs EXT: no edema SKIN: no acute rash Speech and affect wnl, at baseline.  Doesn't appear anxious.  He did check once (only once) to make sure he had all of his papers, etc when leaving the exam room.  No tremor.

## 2014-11-09 LAB — BASIC METABOLIC PANEL
BUN: 12 mg/dL (ref 6–23)
CALCIUM: 9.6 mg/dL (ref 8.4–10.5)
CHLORIDE: 101 meq/L (ref 96–112)
CO2: 28 meq/L (ref 19–32)
CREATININE: 0.91 mg/dL (ref 0.40–1.50)
GFR: 95.05 mL/min (ref >60.00)
Glucose, Bld: 92 mg/dL (ref 70–99)
POTASSIUM: 4.6 meq/L (ref 3.5–5.1)
Sodium: 135 mEq/L (ref 135–145)

## 2014-11-10 NOTE — Assessment & Plan Note (Signed)
Routine anticipatory guidance given to patient. See health maintenance.  Tetanus 2013  Flu shot done at work usually, encouraged.  Prostate and colon cancer screening not due.  Diet and exercise d/w pt. He is trying to exercise more. Diet is fair. D/w pt. Encouraged both.  Living will. Would have his sister designated if patient were incapacitated.  See notes on labs. Prev with lipids acceptable.

## 2014-11-10 NOTE — Assessment & Plan Note (Signed)
Compliant with meds. Still focused on being thorough at work which can potentially be helpful for the patients individually but he isn't able to push through quickly. "I have a reputation for being slow, but that is partly from being conscientious." We talked about making this manageable, coping strategies. He sees a clear benefit from the meds, no ADE. Mood is "in the middle of the road." No SI/HI. He isn't in therapy now, it helped him some previous. I encouraged counseling. He has a job application to go in soon. Okay for outpatient f/u.

## 2014-11-15 LAB — CLOMIPRAMINE
Clomipramine + Desmethylclompi: 265 mcg/L (ref 200–600)
Clomipramine.: 127 mcg/L (ref 50–250)
Desmethylclomipramine: 138 mcg/L — ABNORMAL LOW (ref 150–350)

## 2015-01-13 ENCOUNTER — Other Ambulatory Visit: Payer: Self-pay | Admitting: Family Medicine

## 2015-05-10 ENCOUNTER — Telehealth: Payer: Self-pay | Admitting: Family Medicine

## 2015-05-10 NOTE — Telephone Encounter (Signed)
Pt called stating he needed a updated lipid panel for insurance and asking if Dr. Para Marchuncan can do that order for him. Also he lives in [place] and if there is anywhere close by he might be able to go to instead of driving to the office.  Pt also wanted to check and make sure Dr. Para Marchuncan received his refill request.  [phone]

## 2015-05-10 NOTE — Telephone Encounter (Signed)
Spoke to patient and was advised that he needs a written script for Clomipramine and he mails it to a pharmacy in [place]. Patient also stated that he needs a written lab order and he can go to [facility] and get it drawn. Confirmed patient's mailing address. Advised patient that it may take a few days for him to get the script and lab order because of the office being closed for the holiday and weekend.

## 2015-05-14 MED ORDER — CLOMIPRAMINE HCL 50 MG PO CAPS: 100.0000 mg | ORAL_CAPSULE | Freq: Two times a day (BID) | ORAL | Status: DC

## 2015-05-14 NOTE — Telephone Encounter (Signed)
rx printed.  Please print letter for patient for lipid panel, dx Z13.220.  Thanks.

## 2015-05-15 ENCOUNTER — Encounter: Payer: Self-pay | Admitting: *Deleted

## 2015-05-15 NOTE — Telephone Encounter (Signed)
Order printed and mailed along with Clomipramine Rx.

## 2015-05-24 ENCOUNTER — Other Ambulatory Visit: Payer: Self-pay | Admitting: Family Medicine

## 2015-05-25 LAB — LIPID PANEL WITH LDL/HDL RATIO
Cholesterol, Total: 189 mg/dL (ref 100–199)
HDL: 42 mg/dL (ref >39)
LDL CALC: 113 mg/dL — AB (ref 0–99)
LDl/HDL Ratio: 2.7 ratio units (ref 0.0–3.6)
Triglycerides: 171 mg/dL — ABNORMAL HIGH (ref 0–149)
VLDL CHOLESTEROL CAL: 34 mg/dL (ref 5–40)

## 2015-05-26 ENCOUNTER — Encounter: Payer: Self-pay | Admitting: *Deleted

## 2015-06-14 ENCOUNTER — Telehealth: Payer: Self-pay

## 2015-06-14 NOTE — Telephone Encounter (Signed)
I recall him needing labs done and I recall arranging those orders. I don't recall a specific biometric form.   I don't have any outstanding papers from 2 weeks ago.  If he'll send me a copy, then I'll work on it.  Thanks .

## 2015-06-14 NOTE — Telephone Encounter (Signed)
Pt left v/m; pt mailed a biometric form to Dr Para Marchuncan 2 weeks ago and pt wants to know if this [facility], the fax # was on the form. Pt request cb. Do not see any info under media tab.

## 2015-06-15 NOTE — Telephone Encounter (Signed)
Patient advised.   Patient will try to get it faxed to us today or tomorrow and would appreciate a rapid turn-around of closing time Friday afternoon if at all possible.

## 2015-06-15 NOTE — Telephone Encounter (Signed)
Dr. Para Marchuncan located the biometric form.  Patient advised there is no need to fax the form to us.  Dr. Para Marchuncan will complete it and we will fax it in by 06/16/15.

## 2015-06-16 NOTE — Telephone Encounter (Signed)
Form done, please send in.  Thanks.  

## 2015-06-16 NOTE — Telephone Encounter (Signed)
Faxed form and sent for scanning. 

## 2015-11-17 ENCOUNTER — Other Ambulatory Visit: Payer: Self-pay | Admitting: Family Medicine

## 2015-11-17 NOTE — Telephone Encounter (Signed)
Electronic refill request. Last Filled:    60 mL 5 08/29/2014  Last office visit:   11/08/14 CPE  No upcoming appointments scheduled.  Please advise.

## 2015-11-19 NOTE — Telephone Encounter (Signed)
Sent.  CPE when possible.  Thanks.  

## 2015-11-20 NOTE — Telephone Encounter (Signed)
Left detailed message on voicemail.  

## 2016-01-28 ENCOUNTER — Other Ambulatory Visit: Payer: Self-pay | Admitting: Family Medicine

## 2016-01-28 NOTE — Telephone Encounter (Signed)
Sent. Thanks.   

## 2016-01-29 ENCOUNTER — Encounter: Payer: 59 | Admitting: Family Medicine

## 2016-02-23 ENCOUNTER — Ambulatory Visit (INDEPENDENT_AMBULATORY_CARE_PROVIDER_SITE_OTHER): Payer: 59 | Admitting: Family Medicine

## 2016-02-23 ENCOUNTER — Encounter: Payer: Self-pay | Admitting: Family Medicine

## 2016-02-23 VITALS — BP 120/80 | HR 87 | Temp 98.4°F | Wt 203.2 lb

## 2016-02-23 DIAGNOSIS — Z79899 Other long term (current) drug therapy: Secondary | ICD-10-CM

## 2016-02-23 DIAGNOSIS — Z7189 Other specified counseling: Secondary | ICD-10-CM

## 2016-02-23 DIAGNOSIS — Z0000 Encounter for general adult medical examination without abnormal findings: Secondary | ICD-10-CM

## 2016-02-23 DIAGNOSIS — Z8349 Family history of other endocrine, nutritional and metabolic diseases: Secondary | ICD-10-CM

## 2016-02-23 DIAGNOSIS — F429 Obsessive-compulsive disorder, unspecified: Secondary | ICD-10-CM

## 2016-02-23 LAB — TSH: TSH: 3.48 u[IU]/mL (ref 0.35–4.50)

## 2016-02-23 LAB — BASIC METABOLIC PANEL
BUN: 12 mg/dL (ref 6–23)
CHLORIDE: 102 meq/L (ref 96–112)
CO2: 29 mEq/L (ref 19–32)
CREATININE: 0.89 mg/dL (ref 0.40–1.50)
Calcium: 9.2 mg/dL (ref 8.4–10.5)
GFR: 96.98 mL/min (ref >60.00)
GLUCOSE: 97 mg/dL (ref 70–99)
POTASSIUM: 4.1 meq/L (ref 3.5–5.1)
Sodium: 136 mEq/L (ref 135–145)

## 2016-02-23 MED ORDER — VENLAFAXINE HCL ER 75 MG PO CP24: 75.0000 mg | ORAL_CAPSULE | Freq: Every day | ORAL | 3 refills | Status: DC

## 2016-02-23 MED ORDER — CLOMIPRAMINE HCL 50 MG PO CAPS: 100.0000 mg | ORAL_CAPSULE | Freq: Two times a day (BID) | ORAL | 3 refills | Status: DC

## 2016-02-23 MED ORDER — ALUMINUM CHLORIDE 20 % EX SOLN: CUTANEOUS | 99 refills | Status: DC

## 2016-02-23 NOTE — Progress Notes (Signed)
CPE- See plan.  Routine anticipatory guidance given to patient.  See health maintenance. Tetanus 2013 Flu shot to be done at work.   PNA and shingles shots not due.   Colon and prostate cancer screening not due.  Living will. Would have his sister designated if patient were incapacitated.  He is considering options for a local person, since she is out of town.   HIV screening d/w pt.  He'll consider screening.   Diet and exercise d/w pt.  Encouraged work on both.  He has been jogging intermittently.  He is active o/w.    OCD.  Still on baseline meds.  No ADE on meds.  Still with a desire for checking, and it slows him down at work but "I do a good job." Still able to function at work.  Mood d/w pt.  Mood is good.  No SI/HI.  He still thinks his meds are clearly helping.   drysol helps w/o ADE.    FH thyroid disease.  No tmg noted by patient. Still reasonable to check TSH today.   PMH and SH reviewed  Meds, vitals, and allergies reviewed.   ROS: Per HPI.  Unless specifically indicated otherwise in HPI, the patient denies:  General: fever. Eyes: acute vision changes ENT: sore throat Cardiovascular: chest pain Respiratory: SOB GI: vomiting GU: dysuria Musculoskeletal: acute back pain Derm: acute rash Neuro: acute motor dysfunction Psych: worsening mood Endocrine: polydipsia Heme: bleeding Allergy: hayfever  GEN: nad, alert and oriented HEENT: mucous membranes moist NECK: supple w/o LA CV: rrr. PULM: ctab, no inc wob ABD: soft, +bs EXT: no edema SKIN: no acute rash

## 2016-02-23 NOTE — Patient Instructions (Signed)
Go to the lab on the way out.  We'll contact you with your lab report. Don't change your meds for now.  Take care.  Glad to see you.  Update me as needed.   

## 2016-02-23 NOTE — Assessment & Plan Note (Signed)
Recheck tsh pending.  

## 2016-02-23 NOTE — Assessment & Plan Note (Signed)
Tetanus 2013 Flu shot to be done at work.   PNA and shingles shots not due.   Colon and prostate cancer screening not due.  Living will. Would have his sister designated if patient were incapacitated.  He is considering options for a local person, since she is out of town.   HIV screening d/w pt.  He'll consider screening.   Diet and exercise d/w pt.  Encouraged work on both.  He has been jogging intermittently.  He is active o/w.

## 2016-02-23 NOTE — Progress Notes (Signed)
Pre visit review using our clinic review tool, if applicable. No additional management support is needed unless otherwise documented below in the visit note. 

## 2016-02-23 NOTE — Assessment & Plan Note (Signed)
Continue current meds.  Recheck drug level today.  This will be a peak level, he took med this AM, just before OV.  Okay for outpatient f/u.

## 2016-02-27 ENCOUNTER — Telehealth: Payer: Self-pay | Admitting: Family Medicine

## 2016-02-27 NOTE — Telephone Encounter (Signed)
Message left for patient to return my call.  Please see result note. 

## 2016-02-27 NOTE — Telephone Encounter (Signed)
Pt returned your call.  

## 2016-02-29 LAB — CLOMIPRAMINE
CLOMIPRAMINE+DESMETHYLCLOMIP: 259 ug/L (ref 200–600)
CLOMIPRAMINE.: 150 ug/L (ref 50–250)
DESMETHYLCLOMIPRAMINE: 109 ug/L — AB (ref 150–350)

## 2017-04-08 ENCOUNTER — Other Ambulatory Visit: Payer: Self-pay | Admitting: Family Medicine

## 2017-04-09 NOTE — Telephone Encounter (Signed)
Received refill request electronically Last refill 02/23/16 - 60 ml Last office visit same date

## 2017-04-10 ENCOUNTER — Encounter: Payer: Self-pay | Admitting: *Deleted

## 2017-04-10 ENCOUNTER — Other Ambulatory Visit: Payer: Self-pay | Admitting: *Deleted

## 2017-04-10 ENCOUNTER — Telehealth: Payer: Self-pay | Admitting: Family Medicine

## 2017-04-10 NOTE — Telephone Encounter (Signed)
Copied from CRM [id number]. Topic: Quick Communication - See Telephone Encounter >> Apr 10, 2017  2:26 PM Arlyss Gandyichardson, Aliviana Burdell N, NT wrote: CRM for notification. See Telephone encounter for:  04/10/17. Patient needing refill of Drysol and clomipramine. Clomipramine he has filled in a pharmacy in [place]. Pt stated it may have to mailed to him first.

## 2017-04-10 NOTE — Telephone Encounter (Signed)
Patient requests refills of  Clomipramine.  Please mail Clomipramine Rx because he gets it filled in [place]. Last office visit:   02/23/2016 CPE Last Filled:   Clomipramine 400 capsule 3 02/23/2016  CPE scheduled 04/29/17.  Please advise.

## 2017-04-10 NOTE — Telephone Encounter (Signed)
Sent.  OV when possible. Thanks.  

## 2017-04-10 NOTE — Telephone Encounter (Signed)
Pt requesting medication refill

## 2017-04-10 NOTE — Telephone Encounter (Signed)
Letter mailed

## 2017-04-11 ENCOUNTER — Encounter: Payer: Self-pay | Admitting: *Deleted

## 2017-04-11 MED ORDER — CLOMIPRAMINE HCL 50 MG PO CAPS: 100.0000 mg | ORAL_CAPSULE | Freq: Two times a day (BID) | ORAL | 3 refills | Status: DC

## 2017-04-11 NOTE — Telephone Encounter (Signed)
Printed.  Thanks.  

## 2017-04-11 NOTE — Telephone Encounter (Signed)
Rx mailed to patient as requested. 

## 2017-04-17 ENCOUNTER — Telehealth: Payer: Self-pay | Admitting: Family Medicine

## 2017-04-17 NOTE — Telephone Encounter (Signed)
Copied from CRM #[id number]. Topic: Quick Communication - See Telephone Encounter >> Apr 17, 2017  2:19 PM Windy KalataMichael, Rianna Lukes L, NT wrote: CRM for notification. See Telephone encounter for:  04/17/17.  Pt states he needs his Clomipramine RX sent to [email or web address] in [place]

## 2017-04-17 NOTE — Telephone Encounter (Signed)
Rx was mailed on April 11, 2017 at patient's request.

## 2017-04-29 ENCOUNTER — Ambulatory Visit (INDEPENDENT_AMBULATORY_CARE_PROVIDER_SITE_OTHER): Payer: 59 | Admitting: Family Medicine

## 2017-04-29 ENCOUNTER — Encounter: Payer: Self-pay | Admitting: Family Medicine

## 2017-04-29 VITALS — BP 118/76 | HR 76 | Temp 97.9°F | Ht 68.0 in | Wt 198.8 lb

## 2017-04-29 DIAGNOSIS — Z0000 Encounter for general adult medical examination without abnormal findings: Secondary | ICD-10-CM | POA: Diagnosis not present

## 2017-04-29 DIAGNOSIS — Z8349 Family history of other endocrine, nutritional and metabolic diseases: Secondary | ICD-10-CM | POA: Diagnosis not present

## 2017-04-29 DIAGNOSIS — Z79899 Other long term (current) drug therapy: Secondary | ICD-10-CM

## 2017-04-29 DIAGNOSIS — Z7189 Other specified counseling: Secondary | ICD-10-CM

## 2017-04-29 DIAGNOSIS — F429 Obsessive-compulsive disorder, unspecified: Secondary | ICD-10-CM

## 2017-04-29 DIAGNOSIS — E785 Hyperlipidemia, unspecified: Secondary | ICD-10-CM

## 2017-04-29 DIAGNOSIS — R61 Generalized hyperhidrosis: Secondary | ICD-10-CM

## 2017-04-29 MED ORDER — VENLAFAXINE HCL ER 75 MG PO CP24: 75.0000 mg | ORAL_CAPSULE | Freq: Every day | ORAL | 3 refills | Status: DC

## 2017-04-29 NOTE — Progress Notes (Signed)
CPE- See plan.  Routine anticipatory guidance given to patient.  See health maintenance.  The possibility exists that previously documented standard health maintenance information may have been brought forward from a previous encounter into this note.  If needed, that same information has been updated to reflect the current situation based on today's encounter.    Tetanus 2013 Flu shot done at pharmacy ~ end of 03/2017 PNA and shingles shots not due.   Colon and prostate cancer screening not due.  Living will. Would have his sister designated if patient were incapacitated.  He is considering options for a local person, since she is out of town.   HIV screening d/w pt.  He declined, is reported to be low risk.   Diet and exercise d/w pt.  Encouraged work on both.  He has been jogging intermittently, d/w pt.  He is active o/w.    OCD.  Still on baseline meds.  No ADE on meds.  Still with a desire for checking, and it slows him down at work but "I do a good job." This is unchanged from last year.  Still able to function at work, at baseline.  Mood d/w pt.  Mood is "stable, middle of the road".  No SI/HI.  He still thinks his meds are still very much helping.   He noted that drysol helps w/o ADE.  Used prn.    FH thyroid disease.  No tmg noted by patient. Still reasonable to check TSH today.   PMH and SH reviewed  Meds, vitals, and allergies reviewed.   ROS: Per HPI.  Unless specifically indicated otherwise in HPI, the patient denies:  General: fever. Eyes: acute vision changes ENT: sore throat Cardiovascular: chest pain Respiratory: SOB GI: vomiting GU: dysuria Musculoskeletal: acute back pain Derm: acute rash Neuro: acute motor dysfunction Psych: worsening mood Endocrine: polydipsia Heme: bleeding Allergy: hayfever  GEN: nad, alert and oriented HEENT: mucous membranes moist NECK: supple w/o LA CV: rrr. PULM: ctab, no inc wob ABD: soft, +bs EXT: no edema SKIN: no acute  rash

## 2017-04-29 NOTE — Patient Instructions (Signed)
Go to the lab on the way out.  We'll contact you with your lab report. Take care.  Glad to see you.  Update me as needed.  Don't change your meds for now.

## 2017-04-30 LAB — COMPREHENSIVE METABOLIC PANEL
ALBUMIN: 4.5 g/dL (ref 3.5–5.2)
ALK PHOS: 88 U/L (ref 39–117)
ALT: 18 U/L (ref 0–53)
AST: 20 U/L (ref 0–37)
BUN: 14 mg/dL (ref 6–23)
CALCIUM: 9.7 mg/dL (ref 8.4–10.5)
CHLORIDE: 101 meq/L (ref 96–112)
CO2: 28 mEq/L (ref 19–32)
Creatinine, Ser: 0.89 mg/dL (ref 0.40–1.50)
GFR: 96.5 mL/min (ref >60.00)
Glucose, Bld: 87 mg/dL (ref 70–99)
POTASSIUM: 4.6 meq/L (ref 3.5–5.1)
Sodium: 136 mEq/L (ref 135–145)
TOTAL PROTEIN: 7.7 g/dL (ref 6.0–8.3)
Total Bilirubin: 0.5 mg/dL (ref 0.2–1.2)

## 2017-04-30 LAB — LIPID PANEL
CHOLESTEROL: 197 mg/dL (ref 0–200)
HDL: 40.6 mg/dL (ref >39.00)
LDL CALC: 124 mg/dL — AB (ref 0–99)
NonHDL: 156.61
TRIGLYCERIDES: 165 mg/dL — AB (ref 0.0–149.0)
Total CHOL/HDL Ratio: 5
VLDL: 33 mg/dL (ref 0.0–40.0)

## 2017-04-30 LAB — TSH: TSH: 2.12 u[IU]/mL (ref 0.35–4.50)

## 2017-04-30 NOTE — Assessment & Plan Note (Signed)
Tetanus 2013 Flu shot done at pharmacy ~ end of 03/2017 PNA and shingles shots not due.   Colon and prostate cancer screening not due.  Living will. Would have his sister designated if patient were incapacitated.  He is considering options for a local person, since she is out of town.   HIV screening d/w pt.  He declined, is reported to be low risk.   Diet and exercise d/w pt.  Encouraged work on both.  He has been jogging intermittently, d/w pt.  He is active o/w.

## 2017-04-30 NOTE — Assessment & Plan Note (Signed)
Continue Drysol as needed. 

## 2017-04-30 NOTE — Assessment & Plan Note (Signed)
Continue medications as is.  Mood is stable.  He is functional.  No suicidal or homicidal intent.  We are unable to check a clomipramine level today given the timing of his most recent dose.  Continue as is.  He agrees.  He will update me as needed.

## 2017-04-30 NOTE — Assessment & Plan Note (Signed)
Recheck TSH pending.  See note on labs.  No thyromegaly.

## 2017-04-30 NOTE — Assessment & Plan Note (Signed)
  Living will. Would have his sister designated if patient were incapacitated.  He is considering options for a local person, since she is out of town.

## 2018-04-26 ENCOUNTER — Other Ambulatory Visit: Payer: Self-pay | Admitting: Family Medicine

## 2018-05-05 ENCOUNTER — Encounter: Payer: Self-pay | Admitting: Family Medicine

## 2018-05-05 ENCOUNTER — Ambulatory Visit (INDEPENDENT_AMBULATORY_CARE_PROVIDER_SITE_OTHER): Payer: 59 | Admitting: Family Medicine

## 2018-05-05 VITALS — BP 120/80 | HR 88 | Temp 98.4°F | Ht 68.0 in | Wt 197.5 lb

## 2018-05-05 DIAGNOSIS — Z0000 Encounter for general adult medical examination without abnormal findings: Secondary | ICD-10-CM | POA: Diagnosis not present

## 2018-05-05 DIAGNOSIS — Z7189 Other specified counseling: Secondary | ICD-10-CM

## 2018-05-05 DIAGNOSIS — F429 Obsessive-compulsive disorder, unspecified: Secondary | ICD-10-CM | POA: Diagnosis not present

## 2018-05-05 DIAGNOSIS — R61 Generalized hyperhidrosis: Secondary | ICD-10-CM

## 2018-05-05 DIAGNOSIS — Z8349 Family history of other endocrine, nutritional and metabolic diseases: Secondary | ICD-10-CM

## 2018-05-05 MED ORDER — VENLAFAXINE HCL ER 75 MG PO CP24: 75.0000 mg | ORAL_CAPSULE | Freq: Every day | ORAL | 3 refills | Status: DC

## 2018-05-05 MED ORDER — ALUMINUM CHLORIDE 20 % EX SOLN: CUTANEOUS | 12 refills | Status: DC

## 2018-05-05 MED ORDER — CLOMIPRAMINE HCL 50 MG PO CAPS: 100.0000 mg | ORAL_CAPSULE | Freq: Two times a day (BID) | ORAL | 3 refills | Status: DC

## 2018-05-05 NOTE — Patient Instructions (Signed)
Go to the lab on the way out.  We'll contact you with your lab report. Check to see if cologuard if covered and let me know.  Take care.  Glad to see you.

## 2018-05-05 NOTE — Progress Notes (Signed)
CPE- See plan.  Routine anticipatory guidance given to patient.  See health maintenance.  The possibility exists that previously documented standard health maintenance information may have been brought forward from a previous encounter into this note.  If needed, that same information has been updated to reflect the current situation based on today's encounter.    Tetanus 2013 Flu shot 2019 PNA and shingles shots not due.  Prostate cancer screening and PSA options (with potential risks and benefits of testing vs not testing) were discussed along with recent recs/guidelines.  He declined testing PSA at this point. D/w patient [id number]:options for colon cancer screening, including IFOB vs. colonoscopy.  Risks and benefits of both were discussed and patient voiced understanding.  Pt elects for: cologuard.  He wanted to avoid a false positive with IFOB.  D/w pt.  Living will. Would have his sister designated if patient were incapacitated.   Diet and exercise d/w pt. Encouraged work on both. He has been jogging intermittently, d/w pt.  OCD.   Labs pending.   Last dose of clomipramine 12 hours ago.   No ADE on meds.   Compliant with meds.  Mood is still "middle of the road", he is trying to manage his symptoms, discussed triggers.    No SI/HI.   He is some discouraged with his situation at work, etc, but he is trying to manage that.  He finds fulfillment helping others.    FH thyroid disease noted.  TSH pending.    Drysol helps with excess sweating.   No ADE on med.  He went tandem skydiving for his 52th birthday.    On treatment for L pinkeye, resolving.  He still has a slight amount of fullness in the right ear.  PMH and SH reviewed  Meds, vitals, and allergies reviewed.   ROS: Per HPI.  Unless specifically indicated otherwise in HPI, the patient denies:  General: fever. Eyes: acute vision changes ENT: sore throat Cardiovascular: chest pain Respiratory: SOB GI: vomiting GU:  dysuria Musculoskeletal: acute back pain Derm: acute rash Neuro: acute motor dysfunction Psych: worsening mood Endocrine: polydipsia Heme: bleeding Allergy: hayfever  GEN: nad, alert and oriented HEENT: mucous membranes moist, PERRL, conjunctiva within normal limits bilaterally.  He has minimal amount of clear fluid behind the right TM/SOM without erythema NECK: supple w/o LA CV: rrr. PULM: ctab, no inc wob ABD: soft, +bs EXT: no edema SKIN: no acute rash Speech and affect at baseline, speech is normal.  Judgment appears intact.  He does not have active symptoms of pinkeye at this time.  I would expect his ear symptoms to resolve, discussed.

## 2018-05-06 LAB — COMPREHENSIVE METABOLIC PANEL
ALK PHOS: 121 U/L — AB (ref 39–117)
ALT: 42 U/L (ref 0–53)
AST: 28 U/L (ref 0–37)
Albumin: 4.6 g/dL (ref 3.5–5.2)
BUN: 12 mg/dL (ref 6–23)
CO2: 27 mEq/L (ref 19–32)
Calcium: 9.6 mg/dL (ref 8.4–10.5)
Chloride: 100 mEq/L (ref 96–112)
Creatinine, Ser: 0.88 mg/dL (ref 0.40–1.50)
GFR: 97.36 mL/min (ref >60.00)
GLUCOSE: 83 mg/dL (ref 70–99)
Potassium: 3.9 mEq/L (ref 3.5–5.1)
Sodium: 136 mEq/L (ref 135–145)
Total Bilirubin: 0.3 mg/dL (ref 0.2–1.2)
Total Protein: 8 g/dL (ref 6.0–8.3)

## 2018-05-06 LAB — TSH: TSH: 2.68 u[IU]/mL (ref 0.35–4.50)

## 2018-05-06 NOTE — Assessment & Plan Note (Signed)
Drysol helps with excess sweating.   No ADE on med.  Continue as is.  He agrees.

## 2018-05-06 NOTE — Assessment & Plan Note (Signed)
Recheck TSH pending. 

## 2018-05-06 NOTE — Assessment & Plan Note (Signed)
Still working, tutoring on the side.  Compliant with current medications.  He feels that both provide some benefit.  No suicidal homicidal intent.  Continue as is.  Recheck routine labs today.  Medications refilled.  Update me as needed.  He agrees.  Okay for outpatient follow-up.

## 2018-05-06 NOTE — Assessment & Plan Note (Signed)
Tetanus 2013 Flu shot 2019 PNA and shingles shots not due.  Prostate cancer screening and PSA options (with potential risks and benefits of testing vs not testing) were discussed along with recent recs/guidelines.  He declined testing PSA at this point. D/w patient EA:[id number]:options for colon cancer screening, including IFOB vs. colonoscopy.  Risks and benefits of both were discussed and patient voiced understanding.  Pt elects for: cologuard.  He wanted to avoid a false positive with IFOB.   Living will. Would have his sister designated if patient were incapacitated.   Diet and exercise d/w pt. Encouraged work on both. He has been jogging intermittently, d/w pt.

## 2018-05-06 NOTE — Assessment & Plan Note (Signed)
Living will. Would have his sister designated if patient were incapacitated.  

## 2018-05-10 LAB — CLOMIPRAMINE
Clomipramine.: 115 mcg/L (ref 50–250)
Desemethylclomipramine: 85 mcg/L — ABNORMAL LOW (ref 150–350)
Tot Clomipramine+Desmethylclomipramine: 200 mcg/L (ref 200–600)

## 2018-05-11 ENCOUNTER — Other Ambulatory Visit: Payer: Self-pay | Admitting: Family Medicine

## 2018-05-11 DIAGNOSIS — R748 Abnormal levels of other serum enzymes: Secondary | ICD-10-CM

## 2018-05-13 ENCOUNTER — Encounter: Payer: Self-pay | Admitting: Family Medicine

## 2018-05-19 ENCOUNTER — Telehealth: Payer: Self-pay | Admitting: Family Medicine

## 2018-05-19 NOTE — Telephone Encounter (Signed)
Pt stated that his insurance will pay 100% for cologuard. Please advise pt.

## 2018-05-20 NOTE — Telephone Encounter (Signed)
Please start the process to get the patient set up with Cologuard.  Thanks.

## 2018-05-21 NOTE — Telephone Encounter (Signed)
Form faxed

## 2018-06-16 ENCOUNTER — Other Ambulatory Visit: Payer: Self-pay | Admitting: Family Medicine

## 2018-06-19 LAB — ALKALINE PHOSPHATASE, ISOENZYMES
Alkaline Phosphatase: 114 IU/L (ref 39–117)
BONE FRACTION: 25 % (ref 12–68)
INTESTINAL FRAC.: 0 % (ref 0–18)
LIVER FRACTION: 75 % (ref 13–88)

## 2018-06-28 LAB — COLOGUARD: COLOGUARD: NEGATIVE

## 2018-07-02 ENCOUNTER — Encounter: Payer: Self-pay | Admitting: Family Medicine

## 2019-05-28 ENCOUNTER — Other Ambulatory Visit: Payer: Self-pay | Admitting: *Deleted

## 2019-05-28 NOTE — Telephone Encounter (Signed)
Faxed refill request. Clomipramine Last office visit:   05/05/18   [facility] 06/15/2019 Last Filled:    400 capsule 3 05/05/2018  Please advise.  Please print.  I will need to fax the Rx.

## 2019-05-30 MED ORDER — CLOMIPRAMINE HCL 50 MG PO CAPS: 100.0000 mg | ORAL_CAPSULE | Freq: Two times a day (BID) | ORAL | 3 refills | Status: DC

## 2019-05-30 NOTE — Telephone Encounter (Signed)
printed  Thanks.  

## 2019-05-31 ENCOUNTER — Encounter: Payer: Self-pay | Admitting: Family Medicine

## 2019-05-31 NOTE — Telephone Encounter (Addendum)
Faxed

## 2019-06-15 ENCOUNTER — Encounter: Payer: Self-pay | Admitting: Family Medicine

## 2019-06-15 ENCOUNTER — Other Ambulatory Visit: Payer: Self-pay

## 2019-06-15 ENCOUNTER — Ambulatory Visit (INDEPENDENT_AMBULATORY_CARE_PROVIDER_SITE_OTHER): Payer: 59 | Admitting: Family Medicine

## 2019-06-15 DIAGNOSIS — Z0000 Encounter for general adult medical examination without abnormal findings: Secondary | ICD-10-CM

## 2019-06-15 DIAGNOSIS — R61 Generalized hyperhidrosis: Secondary | ICD-10-CM

## 2019-06-15 DIAGNOSIS — Z7189 Other specified counseling: Secondary | ICD-10-CM | POA: Diagnosis not present

## 2019-06-15 DIAGNOSIS — Z8349 Family history of other endocrine, nutritional and metabolic diseases: Secondary | ICD-10-CM | POA: Diagnosis not present

## 2019-06-15 DIAGNOSIS — F429 Obsessive-compulsive disorder, unspecified: Secondary | ICD-10-CM

## 2019-06-15 MED ORDER — DRYSOL 20 % EX SOLN: CUTANEOUS | 12 refills | Status: DC

## 2019-06-15 MED ORDER — VENLAFAXINE HCL ER 75 MG PO CP24: 75.0000 mg | ORAL_CAPSULE | Freq: Every day | ORAL | 3 refills | Status: DC

## 2019-06-15 NOTE — Progress Notes (Signed)
Virtual visit completed through WebEx or similar program Patient location: home  Provider location: Financial controller at [facility], office   Pandemic considerations d/w pt.   Limitations and rationale for visit method d/w patient.  Patient agreed to proceed.   CC: CPE  HPI:  CPE- See plan.  Routine anticipatory guidance given to patient.  See health maintenance.  The possibility exists that previously documented standard health maintenance information may have been brought forward from a previous encounter into this note.  If needed, that same information has been updated to reflect the current situation based on today's encounter.    Tetanus 2013 Flu shot done at [facility], 04/15/2019 PNA and shingles shots not due.  Prostate cancer screening and PSA options (with potential risks and benefits of testing vs not testing) were discussed along with recent recs/guidelines.  He declined testing PSA at this point. cologuard 2020 Living will. Would have his sister designated if patient were incapacitated.   Diet and exercise d/w pt. Encouraged work on both. He has been jogging intermittently, d/w pt.  His facility had covid exposures.   He has been tested mult times in the meantime.  Discussed cautions.    OCD.   Labs pending.   No ADE on meds.   Compliant with meds.  Mood is still "pretty good", he is trying to manage his symptoms, discussed triggers.    No SI/HI.   He had failed taper of meds prev.   He has enjoyed tutoring.    FH thyroid disease noted.  TSH pending.    Drysol helps with excess sweating.   No ADE on med.  Nickel sized puffy area noted on L anterior/lateral shin.  Not red.  Not on the calf.  Not ttp.  Normal skin color.  Present for weeks.  Not ulcerated.  Would observe for now, as thisdoesn't sound ominous.  He'll update me as needed.    PMH and SH reviewed  ROS: Per HPI unless specifically indicated in ROS section   Meds, vitals, and allergies reviewed.    NAD Speech wnl  A/P:  Tetanus 2013 Flu shot done at [facility], 04/15/2019 PNA and shingles shots not due.  Prostate cancer screening and PSA options (with potential risks and benefits of testing vs not testing) were discussed along with recent recs/guidelines.  He declined testing PSA at this point. cologuard 2020 Living will. Would have his sister designated if patient were incapacitated.   Diet and exercise d/w pt. Encouraged work on both. He has been jogging intermittently, d/w pt.  Nickel sized puffy area noted on L anterior/lateral shin.  Not red.  Not on the calf.  Not ttp.  Normal skin color.  Present for weeks.  Not ulcerated.  Would observe for now, as thisdoesn't sound ominous.  He'll update me as needed.    OCD.   Labs pending.   No ADE on meds.   Compliant with meds.  Mood is still "pretty good", he is trying to manage his symptoms, discussed triggers.    No SI/HI.   He had failed taper of meds prev.   Continue as is.  He agrees.   FH thyroid disease noted.  TSH pending.    Drysol helps with excess sweating.   No ADE on med. Would continue as is.

## 2019-06-17 ENCOUNTER — Encounter: Payer: Self-pay | Admitting: Family Medicine

## 2019-06-17 NOTE — Assessment & Plan Note (Signed)
Living will. Would have his sister designated if patient were incapacitated.  

## 2019-06-17 NOTE — Assessment & Plan Note (Signed)
Labs pending.   No ADE on meds.   Compliant with meds.  Mood is still "pretty good", he is trying to manage his symptoms, discussed triggers.    No SI/HI.   He had failed taper of meds prev.   Continue as is.  He agrees.

## 2019-06-17 NOTE — Assessment & Plan Note (Signed)
Tetanus 2013 Flu shot done at [facility], 04/15/2019 PNA and shingles shots not due.  Prostate cancer screening and PSA options (with potential risks and benefits of testing vs not testing) were discussed along with recent recs/guidelines.  He declined testing PSA at this point. cologuard 2020 Living will. Would have his sister designated if patient were incapacitated.   Diet and exercise d/w pt. Encouraged work on both. He has been jogging intermittently, d/w pt.  Nickel sized puffy area noted on L anterior/lateral shin.  Not red.  Not on the calf.  Not ttp.  Normal skin color.  Present for weeks.  Not ulcerated.  Would observe for now, as thisdoesn't sound ominous.  He'll update me as needed.

## 2019-06-17 NOTE — Assessment & Plan Note (Signed)
  FH thyroid disease noted.  TSH pending.

## 2019-06-17 NOTE — Assessment & Plan Note (Signed)
Drysol helps with excess sweating.   No ADE on med. Would continue as is.

## 2019-07-13 ENCOUNTER — Encounter: Payer: Self-pay | Admitting: *Deleted

## 2019-08-04 ENCOUNTER — Other Ambulatory Visit: Payer: Self-pay | Admitting: Family Medicine

## 2019-08-06 LAB — COMPREHENSIVE METABOLIC PANEL
ALT: 23 IU/L (ref 0–44)
AST: 20 IU/L (ref 0–40)
Albumin/Globulin Ratio: 1.8 (ref 1.2–2.2)
Albumin: 4.6 g/dL (ref 3.8–4.9)
Alkaline Phosphatase: 113 IU/L (ref 39–117)
BUN/Creatinine Ratio: 16 (ref 9–20)
BUN: 15 mg/dL (ref 6–24)
Bilirubin Total: 0.4 mg/dL (ref 0.0–1.2)
CO2: 24 mmol/L (ref 20–29)
Calcium: 9 mg/dL (ref 8.7–10.2)
Chloride: 98 mmol/L (ref 96–106)
Creatinine, Ser: 0.93 mg/dL (ref 0.76–1.27)
GFR calc Af Amer: 109 mL/min/{1.73_m2} (ref >59)
GFR calc non Af Amer: 95 mL/min/{1.73_m2} (ref >59)
Globulin, Total: 2.6 g/dL (ref 1.5–4.5)
Glucose: 86 mg/dL (ref 65–99)
Potassium: 4.4 mmol/L (ref 3.5–5.2)
Sodium: 135 mmol/L (ref 134–144)
Total Protein: 7.2 g/dL (ref 6.0–8.5)

## 2019-08-06 LAB — LIPID PANEL W/O CHOL/HDL RATIO
Cholesterol, Total: 217 mg/dL — ABNORMAL HIGH (ref 100–199)
HDL: 44 mg/dL (ref >39)
LDL Chol Calc (NIH): 138 mg/dL — ABNORMAL HIGH (ref 0–99)
Triglycerides: 192 mg/dL — ABNORMAL HIGH (ref 0–149)
VLDL Cholesterol Cal: 35 mg/dL (ref 5–40)

## 2019-08-06 LAB — CLOMIPRAMINE AND METABOLITE, SERUM
Clomipramine Lvl: 136 ng/mL (ref 70–200)
Norclomipramine,S: 100 ng/mL — ABNORMAL LOW (ref 150–300)
Total (Clo+Norclo): 236 ng/mL (ref 220–500)

## 2019-08-06 LAB — TSH: TSH: 3.05 u[IU]/mL (ref 0.450–4.500)

## 2019-09-20 ENCOUNTER — Telehealth: Payer: Self-pay | Admitting: *Deleted

## 2019-09-20 NOTE — Telephone Encounter (Signed)
Tried to call the patient back and got his voicemail. Message stated that his voicemail was full and was unable to leave a message.

## 2019-09-20 NOTE — Telephone Encounter (Signed)
Patient left a voicemail stating that he is having symptoms and wants to make sure that it is not his heart. Patient stated that he has had tingling in his left shoulder that radiates down to his thumb. Patient stated that he is thinking that it could be a pinched nerve. Patient stated that he did have a little chest pain last night. Patient denies any nausea or dizziness. Patient wants to know if he should get an EKG done and he works in [place].  Called patient back after speaking with Dr. Para March, but got his voicemail. Left a message on voicemail for patient to call back. When patient calls he needs to be advised to go to an Urgent Care or if he is having chest pain now to the ER.

## 2019-09-21 ENCOUNTER — Encounter: Payer: Self-pay | Admitting: Family Medicine

## 2019-09-21 NOTE — Telephone Encounter (Signed)
Noted. Agreed.  Thanks. Will await UC notes.  

## 2019-09-21 NOTE — Telephone Encounter (Signed)
Patient notified as instructed by telephone.Patient stated that he was not able to call back yesterday because he worked second shift.  Patient stated that he is not currently having chest pain. Patient stated that he will go to an Urgent Care for an evaluation.

## 2019-09-28 ENCOUNTER — Ambulatory Visit (INDEPENDENT_AMBULATORY_CARE_PROVIDER_SITE_OTHER): Payer: 59 | Admitting: Family Medicine

## 2019-09-28 ENCOUNTER — Encounter: Payer: Self-pay | Admitting: Family Medicine

## 2019-09-28 ENCOUNTER — Other Ambulatory Visit: Payer: Self-pay

## 2019-09-28 VITALS — BP 130/74 | HR 97 | Temp 96.7°F | Wt 203.1 lb

## 2019-09-28 DIAGNOSIS — M5412 Radiculopathy, cervical region: Secondary | ICD-10-CM

## 2019-09-28 NOTE — Progress Notes (Signed)
This visit occurred during the SARS-CoV-2 public health emergency.  Safety protocols were in place, including screening questions prior to the visit, additional usage of staff PPE, and extensive cleaning of exam room while observing appropriate contact time as indicated for disinfecting solutions.  He had some L arm tingling, down radial side of L arm.  No R sided sx.  No leg sx.  Seen at [facility].  EKG done, no acute changes.  Had xrays done, discharged with dx of radiculopathy.  Started on methylprednisolone, with some improvement.  Less tingling but still occ present.  No weakness.  No exertional sx.  He has 1 tab of methylprednisolone left to take.  He didn't have ADE on methylprednisolone.    Meds, vitals, and allergies reviewed.   ROS: Per HPI unless specifically indicated in ROS section   GEN: nad, alert and oriented HEENT: ncat NECK: supple w/o LA CV: rrr.  PULM: ctab, no inc wob EXT: no edema SKIN: no acute rash CN 2-12 wnl B, S/S/DTR wnl x4

## 2019-09-28 NOTE — Patient Instructions (Signed)
It looks like you had nerve root irritation in you neck causing sensation changes without weakness in your left arm.  If you continue to improve, you don't have to do anything else.  If you have persistent tingling, then reasonable to go to PT or a chiropractor.  If you have any weakness, then let me know so we can set up extra imaging.  Take care.  Glad to see you.

## 2019-09-29 DIAGNOSIS — M5412 Radiculopathy, cervical region: Secondary | ICD-10-CM | POA: Insufficient documentation

## 2019-09-29 NOTE — Assessment & Plan Note (Signed)
No weakness, normal neurologic exam.  Okay for outpatient follow-up.  Finish methylprednisolone.  If he has residual symptoms of tingling without weakness then reasonable to either see PT or chiropractor.  If he has any weakness then he will let me know.  Anatomy discussed with patient.  He agrees with plan.

## 2019-11-05 ENCOUNTER — Telehealth: Payer: Self-pay

## 2019-11-05 DIAGNOSIS — M5412 Radiculopathy, cervical region: Secondary | ICD-10-CM

## 2019-11-05 MED ORDER — PREDNISONE 20 MG PO TABS: ORAL_TABLET | ORAL | 0 refills | Status: DC

## 2019-11-05 NOTE — Telephone Encounter (Signed)
Pt is currently in [place].  He would like to wait to do his MRI the first week of June.

## 2019-11-05 NOTE — Telephone Encounter (Signed)
Would restart steroid taper.  rx sent for that.   He need MRI set up for his neck.  Ordered.  We'll call him about getting that done.  If weakness is getting worse acutely, then he needs to go to ER. Thanks.

## 2019-11-05 NOTE — Telephone Encounter (Signed)
Pt said had OV on 09/28/19; and pt still has tingling in lt arm and about 2 wks ago noticed muscle weakness in lt arm. Pt said affecting pts biceps and deltoid muscle, pt wants to see chiropractor. For more than 1 wk when pt lifts barbells has definite weakness in lt arm. If pt is not lifting anything pt can lift lt arm but lt arm starts feeling weak and tired more easily. Pt is not having any pain. On 11/04/19 pt lifted the vacuum cleaner and pt felt weakness but no pain in lt bicep and deltoid. Pt can do push ups without problem. Per 09/28/19 DR Para March instructed if develop weakness in lt arm to let Dr Para March know and may need imaging. Pt request cb after Dr Para March reviews this note. Pt will not schedule appt with chiropractor until hears from Dr Para March.

## 2019-11-05 NOTE — Telephone Encounter (Signed)
Patient advised.

## 2019-11-07 NOTE — Telephone Encounter (Signed)
Noted. If he is doing worse in the meantime that I want him to get rechecked, regardless of his location. Thanks.

## 2019-11-18 ENCOUNTER — Other Ambulatory Visit: Payer: Self-pay

## 2019-11-18 ENCOUNTER — Ambulatory Visit
Admission: RE | Admit: 2019-11-18 | Discharge: 2019-11-18 | Disposition: A | Discharge status: Home / Self Care | Payer: 59 | Source: Ambulatory Visit | Attending: Family Medicine | Admitting: Family Medicine

## 2019-11-18 DIAGNOSIS — M5412 Radiculopathy, cervical region: Secondary | ICD-10-CM | POA: Insufficient documentation

## 2019-11-20 ENCOUNTER — Ambulatory Visit: Payer: 59

## 2020-03-21 ENCOUNTER — Other Ambulatory Visit: Payer: Self-pay | Admitting: *Deleted

## 2020-03-21 NOTE — Telephone Encounter (Signed)
Faxed refill request. Clomipramine Last office visit:   09/28/2019 Last Filled:    400 capsule 3 05/30/2019

## 2020-03-22 MED ORDER — CLOMIPRAMINE HCL 50 MG PO CAPS: 100.0000 mg | ORAL_CAPSULE | Freq: Two times a day (BID) | ORAL | 1 refills | Status: DC

## 2020-03-22 NOTE — Telephone Encounter (Signed)
Sent. Thanks.  Needs yearly CPE scheduled when possible.

## 2020-03-22 NOTE — Telephone Encounter (Signed)
Please call patient and schedule appointment as instructed. 

## 2020-03-23 NOTE — Telephone Encounter (Signed)
Left detailed message on voicemail to call in for CPE appt.

## 2020-05-01 DIAGNOSIS — B86 Scabies: Secondary | ICD-10-CM | POA: Diagnosis not present

## 2020-06-02 ENCOUNTER — Telehealth: Payer: Self-pay

## 2020-06-02 NOTE — Telephone Encounter (Signed)
Pt is under a lot of stress at work and is asking if he can clomipramine up an extra pill daily. He currently takes 2 tabs twice daily. Says it would be temporary. Please [phone]

## 2020-06-04 MED ORDER — CLOMIPRAMINE HCL 50 MG PO CAPS: ORAL_CAPSULE | ORAL | Status: DC

## 2020-06-04 NOTE — Addendum Note (Signed)
Addended by: Joaquim Nam on: 06/04/2020 01:47 PM   Modules accepted: Orders

## 2020-06-04 NOTE — Telephone Encounter (Signed)
Should be okay to do for now.  Would change to 2 tabs in the AM and 3 tabs in the PM.  If that isn't helping in the next week or so, or if worse in the meantime, then please call us.  I would like an update from patient in about 1 week, either way.  Thanks.

## 2020-06-05 ENCOUNTER — Telehealth: Payer: Self-pay

## 2020-06-05 NOTE — Telephone Encounter (Signed)
Message was sent thru MyChart to pt regarding medication adjustment

## 2020-06-16 DIAGNOSIS — Z20822 Contact with and (suspected) exposure to covid-19: Secondary | ICD-10-CM | POA: Diagnosis not present

## 2020-06-20 ENCOUNTER — Encounter: Payer: 59 | Admitting: Family Medicine

## 2020-06-23 DIAGNOSIS — Z20822 Contact with and (suspected) exposure to covid-19: Secondary | ICD-10-CM | POA: Diagnosis not present

## 2020-06-30 DIAGNOSIS — Z20822 Contact with and (suspected) exposure to covid-19: Secondary | ICD-10-CM | POA: Diagnosis not present

## 2020-07-04 ENCOUNTER — Encounter: Payer: BLUE CROSS/BLUE SHIELD | Admitting: Family Medicine

## 2020-07-07 DIAGNOSIS — Z20822 Contact with and (suspected) exposure to covid-19: Secondary | ICD-10-CM | POA: Diagnosis not present

## 2020-07-10 ENCOUNTER — Other Ambulatory Visit: Payer: Self-pay | Admitting: Family Medicine

## 2020-07-12 ENCOUNTER — Other Ambulatory Visit: Payer: Self-pay

## 2020-07-12 MED ORDER — CLOMIPRAMINE HCL 50 MG PO CAPS: ORAL_CAPSULE | ORAL | 1 refills | Status: DC

## 2020-07-12 NOTE — Telephone Encounter (Signed)
Pharmacy requests refill on: Venlafaxine XR 75 mg   LAST REFILL: 06/15/2019 (Q-90, R-3) LAST OV: 09/28/2019 NEXT OV: 08/01/2020 PHARMACY: [id number] [place], [place]

## 2020-07-12 NOTE — Telephone Encounter (Signed)
I printed Rx.  Please mail to patient after I sign it when I get back to clinic.  Thanks.

## 2020-07-12 NOTE — Telephone Encounter (Signed)
Sent. Thanks.   

## 2020-07-12 NOTE — Telephone Encounter (Signed)
Patient called stating that he needs his Clomipramine refilled -patient has to mail this RX to [facility]. Can we mail this RX to patient (he lives in [place]) and he will mail it to pharmacy. Patient states he discussed this with Dr Para March before.  He takes 2 in the morning and 2 in the evening. He increased this to 2 in the morning and 3 in the evening in December but his stress level is better and he is ok with continuing with regular dose. States last time he received  #400 of tablets.   LOV was on 09/28/19 and Next appointment on 08/01/20.

## 2020-07-13 NOTE — Telephone Encounter (Signed)
Left patient message that rx was mailed today

## 2020-07-21 DIAGNOSIS — Z20822 Contact with and (suspected) exposure to covid-19: Secondary | ICD-10-CM | POA: Diagnosis not present

## 2020-07-29 DIAGNOSIS — Z20822 Contact with and (suspected) exposure to covid-19: Secondary | ICD-10-CM | POA: Diagnosis not present

## 2020-08-01 ENCOUNTER — Other Ambulatory Visit: Payer: Self-pay

## 2020-08-01 ENCOUNTER — Encounter: Payer: Self-pay | Admitting: Family Medicine

## 2020-08-01 ENCOUNTER — Ambulatory Visit (INDEPENDENT_AMBULATORY_CARE_PROVIDER_SITE_OTHER): Payer: BLUE CROSS/BLUE SHIELD | Admitting: Family Medicine

## 2020-08-01 VITALS — BP 100/60 | HR 90 | Temp 97.6°F | Ht 68.0 in | Wt 193.0 lb

## 2020-08-01 DIAGNOSIS — Z0000 Encounter for general adult medical examination without abnormal findings: Secondary | ICD-10-CM | POA: Diagnosis not present

## 2020-08-01 DIAGNOSIS — M5412 Radiculopathy, cervical region: Secondary | ICD-10-CM

## 2020-08-01 DIAGNOSIS — Z79899 Other long term (current) drug therapy: Secondary | ICD-10-CM

## 2020-08-01 DIAGNOSIS — M5410 Radiculopathy, site unspecified: Secondary | ICD-10-CM

## 2020-08-01 DIAGNOSIS — Z7189 Other specified counseling: Secondary | ICD-10-CM

## 2020-08-01 DIAGNOSIS — Z8349 Family history of other endocrine, nutritional and metabolic diseases: Secondary | ICD-10-CM

## 2020-08-01 DIAGNOSIS — F429 Obsessive-compulsive disorder, unspecified: Secondary | ICD-10-CM

## 2020-08-01 NOTE — Patient Instructions (Signed)
Go to the lab on the way out.   If you have mychart we'll likely use that to update you.    Use the back exercises and update me as needed.   Take care.  Glad to see you.

## 2020-08-01 NOTE — Progress Notes (Signed)
This visit occurred during the SARS-CoV-2 public health emergency.  Safety protocols were in place, including screening questions prior to the visit, additional usage of staff PPE, and extensive cleaning of exam room while observing appropriate contact time as indicated for disinfecting solutions.  CPE- See plan.  Routine anticipatory guidance given to patient.  See health maintenance.  The possibility exists that previously documented standard health maintenance information may have been brought forward from a previous encounter into this note.  If needed, that same information has been updated to reflect the current situation based on today's encounter.    Tetanus 2013 Flu shotusually yearly.   PNA not due.  Shingles 1st dose 2022 covid vaccine up to date Prostate cancer screening and PSA options(with potential risks and benefits of testing vs not testing) were discussed along with recent recs/guidelines. He declined testing PSAat this point. cologuard 2020 Living will. Would have his sister designated if patient were incapacitated.  Diet and exercise d/w pt. Encouraged work on both. He has been jogging intermittently, d/w pt.  OCD.  We talked about stressors, especially news and world events.  Still on venlafaxine and clomipramine at baseline, used with improvement of OCD and anxiety.  No ADE on med.  His work load is manageable.  No SI/HI.  He wanted to continue meds as is.  He has support from his mother.    His L arm sx are better.  No pain.  His L arm strength is getting better.    He has occ sensation in the R leg/buttock when on his feet most of the day.  Not on the left side.  Not significant pain but it is not altered sensation with a radicular pattern on the right side.  See notes on labs.  PMH and SH reviewed  Meds, vitals, and allergies reviewed.   ROS: Per HPI.  Unless specifically indicated otherwise in HPI, the patient denies:  General: fever. Eyes: acute vision  changes ENT: sore throat Cardiovascular: chest pain Respiratory: SOB GI: vomiting GU: dysuria Musculoskeletal: acute back pain Derm: acute rash Neuro: acute motor dysfunction Psych: worsening mood Endocrine: polydipsia Heme: bleeding Allergy: hayfever  GEN: nad, alert and oriented HEENT: ncat NECK: supple w/o LA CV: rrr. PULM: ctab, no inc wob ABD: soft, +bs EXT: no edema SKIN: Well-perfused. S/S wnl x4, equal grip bilaterally.

## 2020-08-02 DIAGNOSIS — M5410 Radiculopathy, site unspecified: Secondary | ICD-10-CM | POA: Insufficient documentation

## 2020-08-02 LAB — COMPREHENSIVE METABOLIC PANEL
ALT: 17 U/L (ref 0–53)
AST: 18 U/L (ref 0–37)
Albumin: 4.4 g/dL (ref 3.5–5.2)
Alkaline Phosphatase: 94 U/L (ref 39–117)
BUN: 17 mg/dL (ref 6–23)
CO2: 29 mEq/L (ref 19–32)
Calcium: 9.7 mg/dL (ref 8.4–10.5)
Chloride: 100 mEq/L (ref 96–112)
Creatinine, Ser: 0.93 mg/dL (ref 0.40–1.50)
GFR: 94.47 mL/min (ref >60.00)
Glucose, Bld: 81 mg/dL (ref 70–99)
Potassium: 4.3 mEq/L (ref 3.5–5.1)
Sodium: 137 mEq/L (ref 135–145)
Total Bilirubin: 0.4 mg/dL (ref 0.2–1.2)
Total Protein: 7.4 g/dL (ref 6.0–8.3)

## 2020-08-02 LAB — LIPID PANEL
Cholesterol: 221 mg/dL — ABNORMAL HIGH (ref 0–200)
HDL: 48.7 mg/dL (ref >39.00)
LDL Cholesterol: 141 mg/dL — ABNORMAL HIGH (ref 0–99)
NonHDL: 172.05
Total CHOL/HDL Ratio: 5
Triglycerides: 156 mg/dL — ABNORMAL HIGH (ref 0.0–149.0)
VLDL: 31.2 mg/dL (ref 0.0–40.0)

## 2020-08-02 LAB — TSH: TSH: 2.67 u[IU]/mL (ref 0.35–4.50)

## 2020-08-02 NOTE — Assessment & Plan Note (Signed)
Living will. Would have his sister designated if patient were incapacitated.

## 2020-08-02 NOTE — Assessment & Plan Note (Signed)
Tetanus 2013 Flu shotusually yearly.   PNA not due.  Shingles 1st dose 2022 covid vaccine up to date Prostate cancer screening and PSA options(with potential risks and benefits of testing vs not testing) were discussed along with recent recs/guidelines. He declined testing PSAat this point. cologuard 2020 Living will. Would have his sister designated if patient were incapacitated.  Diet and exercise d/w pt. Encouraged work on both. He has been jogging intermittently, d/w pt.

## 2020-08-02 NOTE — Assessment & Plan Note (Signed)
Likely a lower back issue that at this point is still mild.  Discussed routine care and I gave him a handout for back exercises/stretches to do in the meantime.  He will update me as needed.  Would be okay to defer imaging at this point.

## 2020-08-02 NOTE — Assessment & Plan Note (Signed)
Normal strength and sensation.  Would continue as is.  Update me as needed.

## 2020-08-02 NOTE — Assessment & Plan Note (Signed)
No change in medications at this point.  Still okay for outpatient follow-up.  Continue venlafaxine and clomipramine.  We talked about management of stressors and he will update me as needed.

## 2020-08-03 LAB — CLOMIPRAMINE AND METABOLITE, SERUM
Clomipramine Lvl: 189 ng/mL (ref 70–200)
Norclomipramine,S: 130 ng/mL — ABNORMAL LOW (ref 150–300)
Total (Clo+Norclo): 319 ng/mL (ref 220–500)

## 2020-10-31 DIAGNOSIS — Z20822 Contact with and (suspected) exposure to covid-19: Secondary | ICD-10-CM | POA: Diagnosis not present

## 2020-11-14 DIAGNOSIS — Z20822 Contact with and (suspected) exposure to covid-19: Secondary | ICD-10-CM | POA: Diagnosis not present

## 2020-11-21 DIAGNOSIS — Z20822 Contact with and (suspected) exposure to covid-19: Secondary | ICD-10-CM | POA: Diagnosis not present

## 2020-11-23 ENCOUNTER — Other Ambulatory Visit: Payer: Self-pay | Admitting: Family Medicine

## 2021-01-04 ENCOUNTER — Telehealth: Payer: Self-pay

## 2021-01-04 NOTE — Telephone Encounter (Signed)
Vm from pt asking if he can increase either clomipramine or venlafaxine by 1 pill/cap.  Not having a lot of stress but having trouble getting papers graded quickly at new job.  Says he's not sure if it will help.  Requests a vm (can't answer phn at work) with Dr. Lianne Bushy response at [phone].

## 2021-01-05 MED ORDER — VENLAFAXINE HCL ER 75 MG PO CP24: ORAL_CAPSULE | ORAL | Status: DC

## 2021-01-05 NOTE — Telephone Encounter (Signed)
Patient notified as instructed by telephone and verbalized understanding. 

## 2021-01-05 NOTE — Telephone Encounter (Signed)
I would not increase clomipramine.  He can try increasing from 75 to 150 mg of Effexor daily.  He could use the pills that he already has and see what effect he notices in the next week or so.  Please update [place] in about a week.  Thanks.

## 2021-01-05 NOTE — Addendum Note (Signed)
Addended by: Joaquim Nam on: 01/05/2021 01:52 PM   Modules accepted: Orders

## 2021-01-05 NOTE — Telephone Encounter (Signed)
Left message on voicemail for patient to call the office back. 

## 2021-01-16 ENCOUNTER — Telehealth: Payer: Self-pay | Admitting: *Deleted

## 2021-01-16 NOTE — Telephone Encounter (Signed)
Duly noted.  If he continues to have troubles the let us know/set up f/u visit.  Thanks.

## 2021-01-16 NOTE — Telephone Encounter (Signed)
Patient left a voicemail stating that he wanted to let Dr. Para March know the extra dose of Venlafaxine did not make any different. Patient stated that he just wanted to let Dr. Para March know that he is going back on the original dose since the extra one did not help him.

## 2021-01-16 NOTE — Telephone Encounter (Signed)
Advised patient Michael Hull is aware of medication dose change. Advised patient to call back or set up appt if he has any more trouble.

## 2021-02-14 ENCOUNTER — Other Ambulatory Visit: Payer: Self-pay

## 2021-02-14 NOTE — Progress Notes (Signed)
Error

## 2021-02-23 ENCOUNTER — Telehealth: Payer: Self-pay | Admitting: Family Medicine

## 2021-02-23 NOTE — Telephone Encounter (Signed)
Error

## 2021-02-26 ENCOUNTER — Telehealth: Payer: Self-pay | Admitting: Family Medicine

## 2021-02-26 NOTE — Telephone Encounter (Signed)
  Encourage patient to contact the pharmacy for refills or they can request refills through [facility]  LAST APPOINTMENT DATE:  Please schedule appointment if longer than 1 year  NEXT APPOINTMENT DATE:  MEDICATION:clomiPRAMINE     Is the patient out of medication?   PHARMACY:mailed to him so he can send to [facility]  Let patient know to contact pharmacy at the end of the day to make sure medication is ready.  Please notify patient to allow 48-72 hours to process  CLINICAL FILLS OUT ALL BELOW:   LAST REFILL:  QTY:  REFILL DATE:    OTHER COMMENTS:    Okay for refill?  Please advise

## 2021-03-01 MED ORDER — CLOMIPRAMINE HCL 50 MG PO CAPS: ORAL_CAPSULE | ORAL | 1 refills | Status: DC

## 2021-03-01 NOTE — Telephone Encounter (Signed)
Patient wants rx printed so he can mail to [facility]; I have printed the rx to be signed.

## 2021-03-01 NOTE — Addendum Note (Signed)
Addended by: Wendie Simmer B on: 03/01/2021 09:29 AM   Modules accepted: Orders

## 2021-03-01 NOTE — Telephone Encounter (Signed)
Left patient VM that rx was put in the mail for him

## 2021-05-30 ENCOUNTER — Encounter: Payer: Self-pay | Admitting: Family Medicine

## 2021-05-31 ENCOUNTER — Encounter: Payer: BLUE CROSS/BLUE SHIELD | Admitting: Family Medicine

## 2021-07-12 ENCOUNTER — Other Ambulatory Visit: Payer: Self-pay

## 2021-07-12 ENCOUNTER — Encounter: Payer: Self-pay | Admitting: Family Medicine

## 2021-07-12 ENCOUNTER — Ambulatory Visit (INDEPENDENT_AMBULATORY_CARE_PROVIDER_SITE_OTHER): Payer: 59 | Admitting: Family Medicine

## 2021-07-12 VITALS — BP 142/70 | HR 102 | Temp 98.0°F | Ht 68.0 in | Wt 196.0 lb

## 2021-07-12 DIAGNOSIS — F429 Obsessive-compulsive disorder, unspecified: Secondary | ICD-10-CM | POA: Diagnosis not present

## 2021-07-12 DIAGNOSIS — E785 Hyperlipidemia, unspecified: Secondary | ICD-10-CM | POA: Diagnosis not present

## 2021-07-12 DIAGNOSIS — R69 Illness, unspecified: Secondary | ICD-10-CM | POA: Diagnosis not present

## 2021-07-12 DIAGNOSIS — Z79899 Other long term (current) drug therapy: Secondary | ICD-10-CM

## 2021-07-12 DIAGNOSIS — R61 Generalized hyperhidrosis: Secondary | ICD-10-CM

## 2021-07-12 DIAGNOSIS — Z0000 Encounter for general adult medical examination without abnormal findings: Secondary | ICD-10-CM

## 2021-07-12 DIAGNOSIS — Z8349 Family history of other endocrine, nutritional and metabolic diseases: Secondary | ICD-10-CM | POA: Diagnosis not present

## 2021-07-12 DIAGNOSIS — Z7189 Other specified counseling: Secondary | ICD-10-CM

## 2021-07-12 DIAGNOSIS — Z1211 Encounter for screening for malignant neoplasm of colon: Secondary | ICD-10-CM

## 2021-07-12 MED ORDER — VENLAFAXINE HCL ER 75 MG PO CP24: ORAL_CAPSULE | ORAL | 3 refills | Status: DC

## 2021-07-12 MED ORDER — DRYSOL 20 % EX SOLN: CUTANEOUS | 3 refills | Status: DC

## 2021-07-12 MED ORDER — CLOMIPRAMINE HCL 50 MG PO CAPS: ORAL_CAPSULE | ORAL | 3 refills | Status: DC

## 2021-07-12 NOTE — Patient Instructions (Addendum)
I'll send the order for cologuard.  If you want someone other than your sister designated to speak on your behalf if you are incapacitated, then let me know.  We can update your advance directive.   Go to the lab on the way out.   If you have mychart we'll likely use that to update you.    Take care.  Glad to see you.

## 2021-07-12 NOTE — Progress Notes (Signed)
This visit occurred during the SARS-CoV-2 public health emergency.  Safety protocols were in place, including screening questions prior to the visit, additional usage of staff PPE, and extensive cleaning of exam room while observing appropriate contact time as indicated for disinfecting solutions.  CPE- See plan.  Routine anticipatory guidance given to patient.  See health maintenance.  The possibility exists that previously documented standard health maintenance information may have been brought forward from a previous encounter into this note.  If needed, that same information has been updated to reflect the current situation based on today's encounter.    Tetanus 2013 Flu shot usually yearly.   PNA not due.  Shingles 1st dose 2022  covid vaccine up to date Prostate cancer screening and PSA options (with potential risks and benefits of testing vs not testing) were discussed along with recent recs/guidelines.  He declined testing PSA at this point. cologuard ordered 2023.   Living will. Would have his sister designated if patient were incapacitated.   Diet and exercise d/w pt.  Encouraged work on both.    HLD.  Recheck labs pending.  Jogging some.  Diet and exercise d/w pt.    Mood d/w pt.  Still on venlafaxine and clomipramine at baseline.  He stopped work at a [employer].  He feels better about the change.  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  D/w pt.  He had HAV IPV and shringrix vaccines recently.  D/w pt.  He is going to take vivotef, d/w pt.  He is going to see about employment when he gets back.  He is looking forward to the trip and the opportunity to teach.  No SI/HI.  D/w pt about using drysol.  Rx sent.  It worked prev for hyperhydrosis.    PMH and SH reviewed  Meds, vitals, and allergies reviewed.   ROS: Per HPI.  Unless specifically indicated otherwise in HPI, the patient denies:  General: fever. Eyes: acute vision changes ENT: sore  throat Cardiovascular: chest pain Respiratory: SOB GI: vomiting GU: dysuria Musculoskeletal: acute back pain Derm: acute rash Neuro: acute motor dysfunction Psych: worsening mood Endocrine: polydipsia Heme: bleeding Allergy: hayfever  GEN: nad, alert and oriented HEENT: ncat NECK: supple w/o LA CV: rrr. PULM: ctab, no inc wob ABD: soft, +bs EXT: no edema SKIN: well perfused.

## 2021-07-13 LAB — LIPID PANEL
Cholesterol: 211 mg/dL — ABNORMAL HIGH (ref 0–200)
HDL: 50.2 mg/dL (ref >39.00)
LDL Cholesterol: 151 mg/dL — ABNORMAL HIGH (ref 0–99)
NonHDL: 160.74
Total CHOL/HDL Ratio: 4
Triglycerides: 48 mg/dL (ref 0.0–149.0)
VLDL: 9.6 mg/dL (ref 0.0–40.0)

## 2021-07-13 LAB — COMPREHENSIVE METABOLIC PANEL
ALT: 19 U/L (ref 0–53)
AST: 23 U/L (ref 0–37)
Albumin: 4.9 g/dL (ref 3.5–5.2)
Alkaline Phosphatase: 113 U/L (ref 39–117)
BUN: 18 mg/dL (ref 6–23)
CO2: 25 mEq/L (ref 19–32)
Calcium: 9.8 mg/dL (ref 8.4–10.5)
Chloride: 97 mEq/L (ref 96–112)
Creatinine, Ser: 0.97 mg/dL (ref 0.40–1.50)
GFR: 89.22 mL/min (ref >60.00)
Glucose, Bld: 63 mg/dL — ABNORMAL LOW (ref 70–99)
Potassium: 4.6 mEq/L (ref 3.5–5.1)
Sodium: 133 mEq/L — ABNORMAL LOW (ref 135–145)
Total Bilirubin: 0.6 mg/dL (ref 0.2–1.2)
Total Protein: 7.8 g/dL (ref 6.0–8.3)

## 2021-07-13 LAB — TSH: TSH: 0.85 u[IU]/mL (ref 0.35–5.50)

## 2021-07-14 LAB — CLOMIPRAMINE AND METABOLITE, SERUM
Clomipramine Lvl: 191 ng/mL (ref 70–200)
Norclomipramine,S: 153 ng/mL (ref 150–300)
Total (Clo+Norclo): 344 ng/mL (ref 220–500)

## 2021-07-16 DIAGNOSIS — E785 Hyperlipidemia, unspecified: Secondary | ICD-10-CM | POA: Insufficient documentation

## 2021-07-16 NOTE — Assessment & Plan Note (Signed)
Continue Drysol as needed. 

## 2021-07-16 NOTE — Assessment & Plan Note (Signed)
Living will. Would have his sister designated if patient were incapacitated.  

## 2021-07-16 NOTE — Assessment & Plan Note (Signed)
Tetanus 2013 Flu shot usually yearly.   PNA not due.  Shingles 1st dose 2022  covid vaccine up to date Prostate cancer screening and PSA options (with potential risks and benefits of testing vs not testing) were discussed along with recent recs/guidelines.  He declined testing PSA at this point. cologuard ordered 2023.   Living will. Would have his sister designated if patient were incapacitated.   Diet and exercise d/w pt.  Encouraged work on both.

## 2021-07-16 NOTE — Assessment & Plan Note (Signed)
Recheck labs pending.  Jogging some.  Diet and exercise d/w pt. see notes on labs.

## 2021-07-16 NOTE — Assessment & Plan Note (Signed)
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  We talked about routine travel cautions.  He has had appropriate vaccination advice.  Would continue venlafaxine and clomipramine.  See notes on labs.  Okay for outpatient follow-up.  No suicidal homicidal intent.  His mood is better on medications.

## 2021-07-20 DIAGNOSIS — Z1211 Encounter for screening for malignant neoplasm of colon: Secondary | ICD-10-CM | POA: Diagnosis not present

## 2021-07-28 LAB — COLOGUARD: COLOGUARD: NEGATIVE

## 2021-11-26 IMAGING — MR MR CERVICAL SPINE W/O CM
5 series · 39 of 48 positions shown · non-contrast
Comparison: None.

CLINICAL DATA: Cervical radiculopathy.  Left arm weakness.

EXAM:
MRI CERVICAL SPINE WITHOUT CONTRAST
TECHNIQUE: Multiplanar, multisequence MR imaging of the cervical spine was
performed. No intravenous contrast was administered.

[Series 5: T2 · sagittal · 3.0mm · 0.62mm/px · 6 of 15 slices shown (1 of 2)]
[im 1/15]
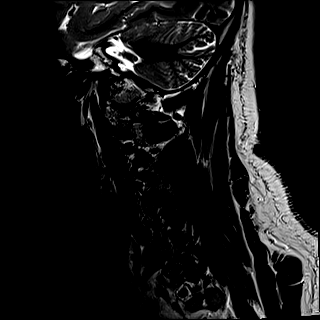
[im 3/15]
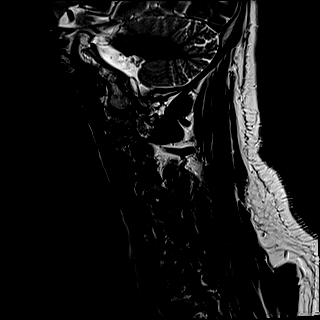
[im 6/15]
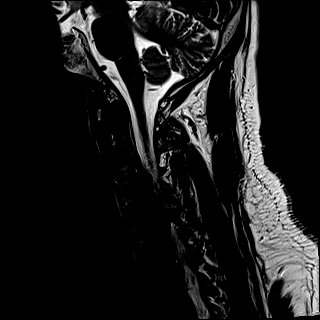
[im 9/15]
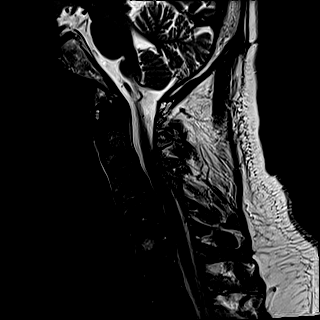
[im 12/15]
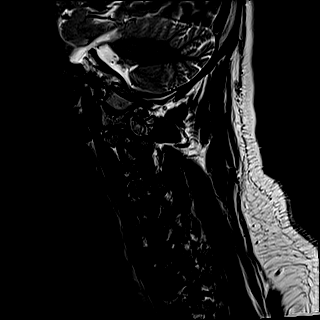
[im 15/15]
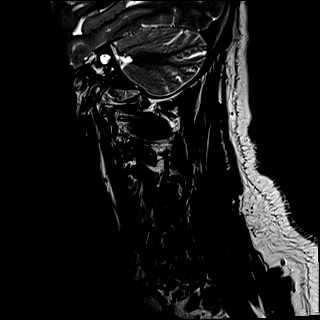

[Series 6: FLAIR · sagittal · 3.0mm · 0.78mm/px · 7 of 15 slices shown]
[im 1/15]
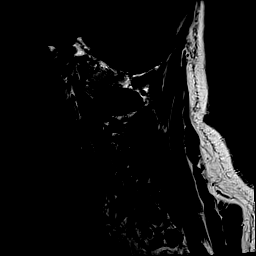
[im 3/15]
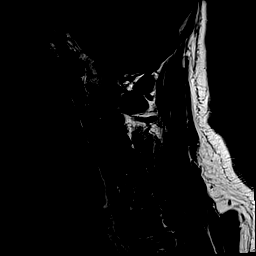
[im 5/15]
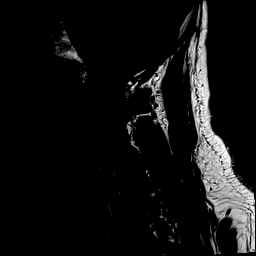
[im 8/15]
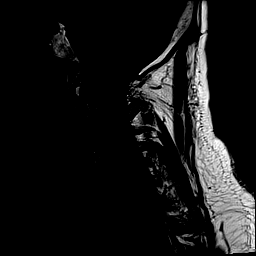
[im 10/15]
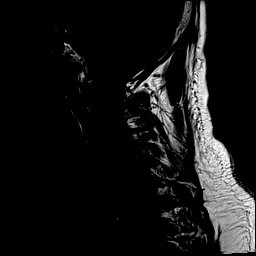
[im 12/15]
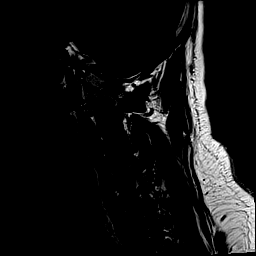
[im 15/15]
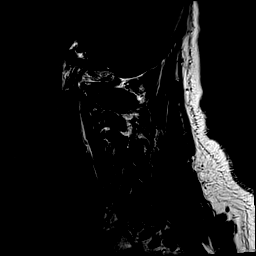

[Series 7: STIR · sagittal · 3.0mm · 0.62mm/px · 7 of 15 slices shown]
[im 1/15]
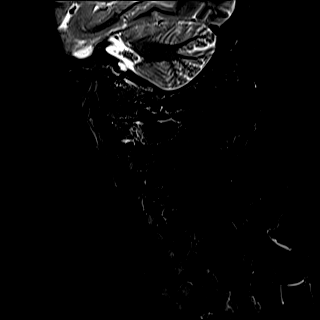
[im 3/15]
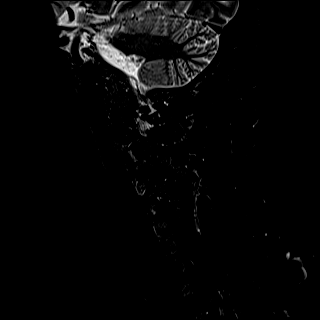
[im 5/15]
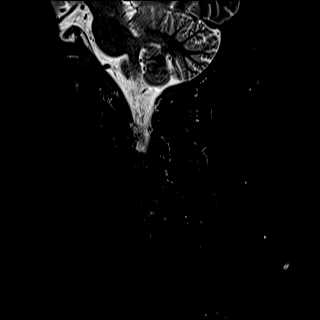
[im 8/15]
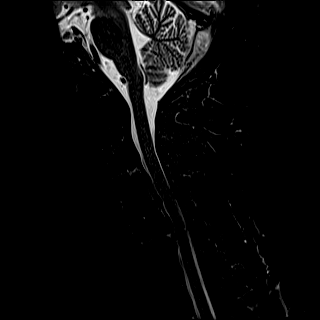
[im 10/15]
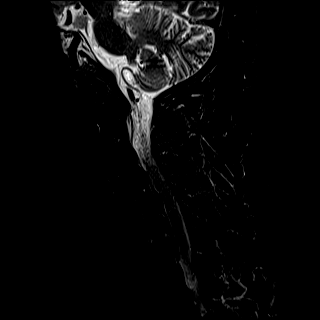
[im 12/15]
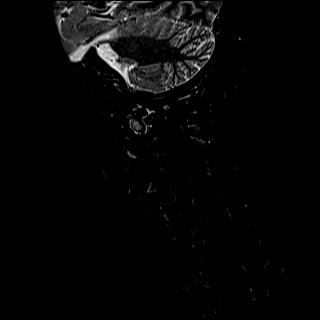
[im 15/15]
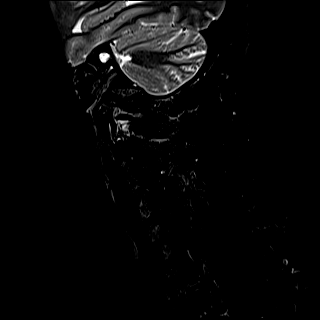

[Series 8: T2 · axial · 3.0mm · 0.70mm/px · z∈[-151,-57]mm · 11 of 29 slices shown (2 of 2)]
[im 1/29]
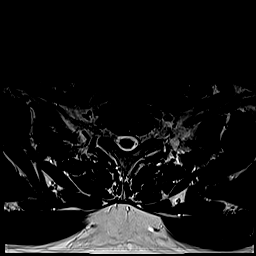
[im 3/29]
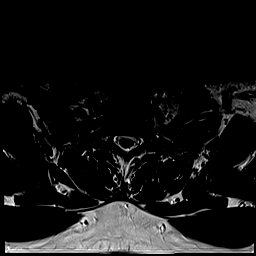
[im 5/29]
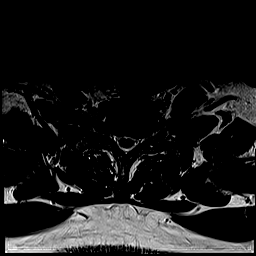
[im 7/29]
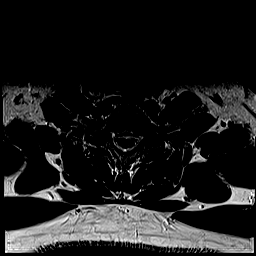
[im 9/29]
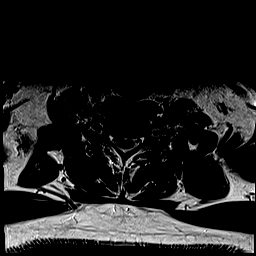
[im 11/29]
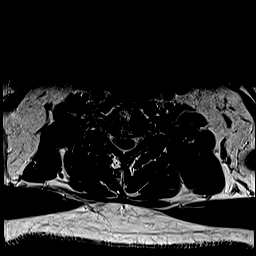
[im 13/29]
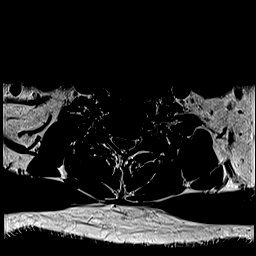
[im 16/29]
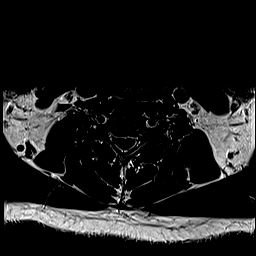
[im 20/29]
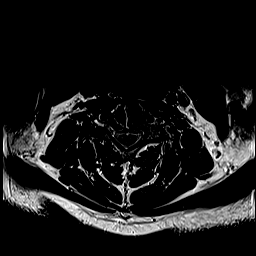
[im 24/29]
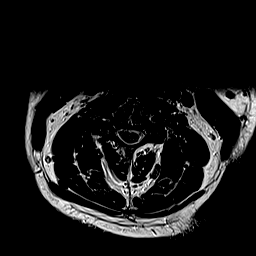
[im 29/29]
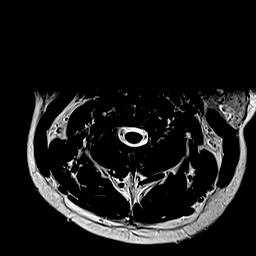

[Series 9: ax mpgr · axial · 3.0mm · 0.35mm/px · z∈[-151,-57]mm · 8 of 29 slices shown]
[im 1/29]
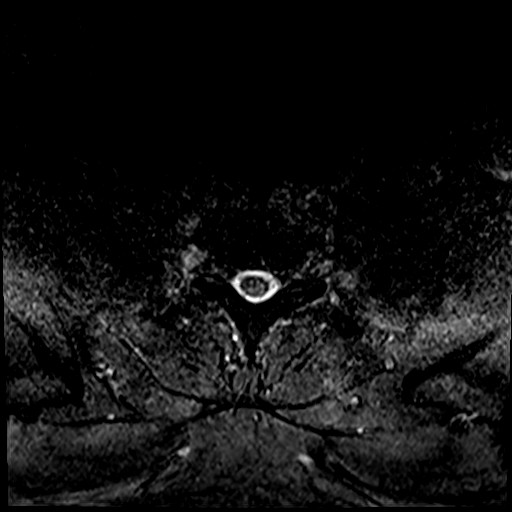
[im 5/29]
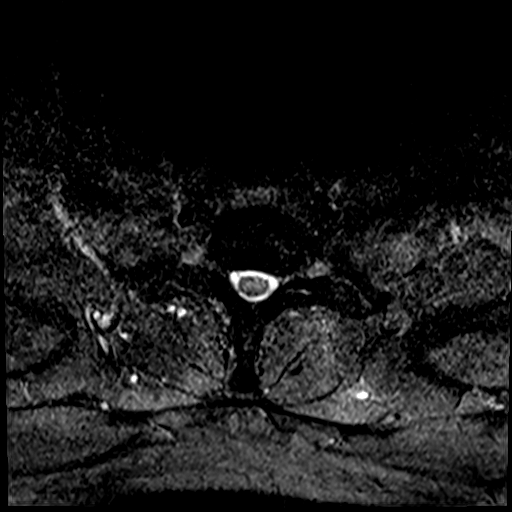
[im 9/29]
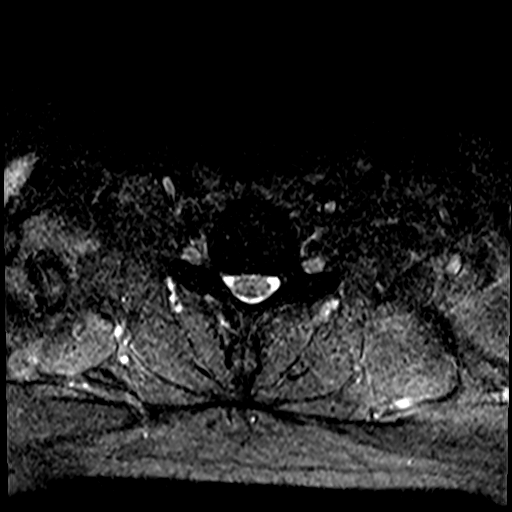
[im 13/29]
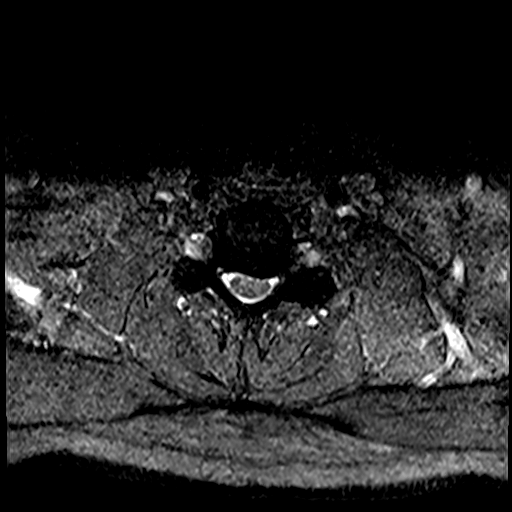
[im 16/29]
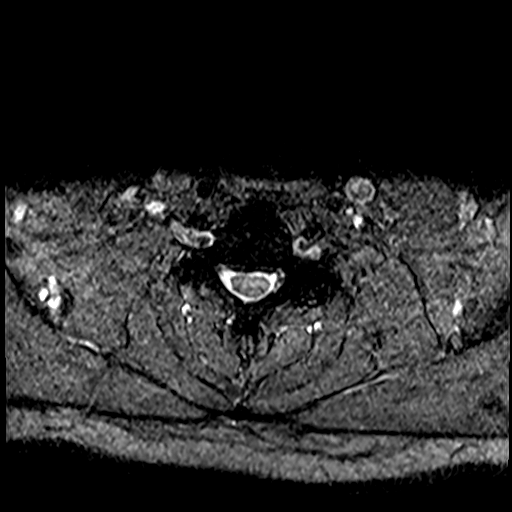
[im 20/29]
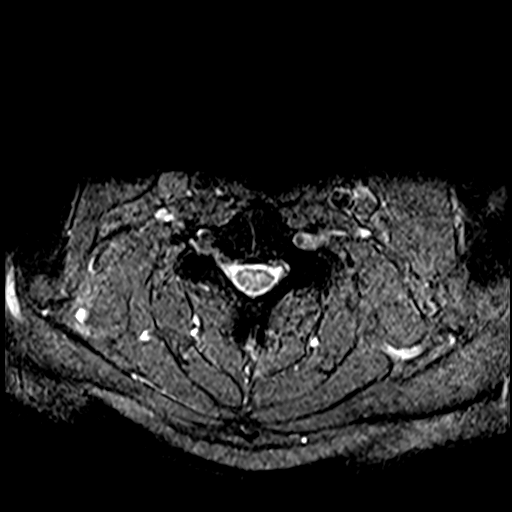
[im 24/29]
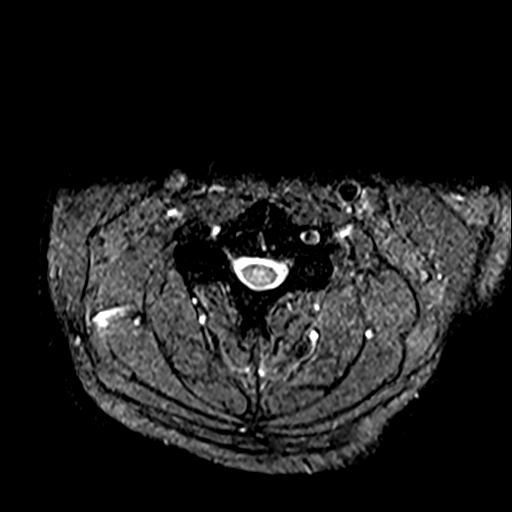
[im 29/29]
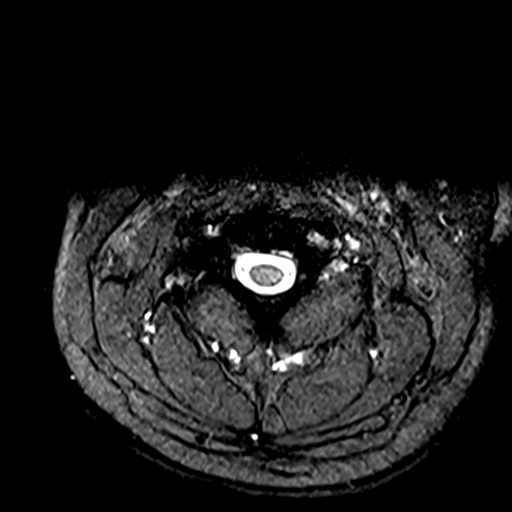

[39 of 48 positions shown; findings below may reference images not displayed]

FINDINGS: Alignment: Straightening of the normal cervical lordosis.

Vertebrae: No fracture, evidence of discitis, or bone lesion.

Cord: Normal signal and morphology.

Posterior Fossa, vertebral arteries, paraspinal tissues: Negative.

Disc levels:

C2-3: Normal disc. Slight bilateral facet arthritis. No foraminal
stenosis.

C3-4: Prominent disc osteophyte complex into the left lateral recess
and left neural foramen which could affect the left C4 nerve. Mild
right facet arthritis. Moderate right foraminal narrowing due to
less prominent uncinate spurs.

C4-5: Disc osteophyte complex into the right lateral recess and
right neural foramen. Smaller disc osteophyte complex extends into
the left neural foramen. Severe left facet arthritis. Bilateral
foraminal stenosis. This could affect either or both C5 nerves.

C5-6: Disc space narrowing. Broad-based disc osteophyte complex
central and to the left extending into the left neural foramen with
slight left foraminal stenosis. Mild left facet arthritis.

C6-7: Disc space narrowing. Small disc osteophyte complex extends
into the right lateral recess without foraminal stenosis. No facet
arthritis.

C7-T1: Small disc osteophyte complex extends into the left neural
foramen without significant foraminal stenosis. No facet arthritis.
IMPRESSION: 1. Left lateral recess and foraminal impingement at C3-4 which could
affect the left C4 nerve.
2. Bilateral foraminal stenosis at C4-5 which could affect either or
both C5 nerves.
3. Severe left facet arthritis at C4-5.

## 2022-05-07 ENCOUNTER — Telehealth: Payer: Self-pay | Admitting: Family Medicine

## 2022-05-07 NOTE — Telephone Encounter (Signed)
Okay to print for #400 and fax to [facility] pharmacy?  Last office visit 07/12/21 for CPE.  Last refilled 07/12/21 for #400 with 3 refills.  No future appointments.  Please advise.

## 2022-05-07 NOTE — Telephone Encounter (Signed)
Patient called in to leave fax number [phone],and phone number [phone] for verbal rx line for his pharmacy in [place],regarding medicationclomiPRAMINE (ANAFRANIL) 50 MG capsule   ,the pharmacy was  suppose to have sent over a fax for a a new prescription to be approved for this medication.

## 2022-05-08 MED ORDER — CLOMIPRAMINE HCL 50 MG PO CAPS: ORAL_CAPSULE | ORAL | 0 refills | Status: DC

## 2022-05-08 NOTE — Telephone Encounter (Signed)
Please make sure it printed and hold for me to sign upon return to clinic.  Please see about getting patient schedule for yearly visit in 2024.  Thanks.

## 2022-05-14 NOTE — Telephone Encounter (Signed)
Called and left message for patient that rx is being mailed to him. I tried faxing rx to his pharmacy 4 times and failed each time.

## 2022-05-21 NOTE — Telephone Encounter (Signed)
Patient called in and stated that he received the letter in the mail but it wasn't the normal prescription he receives. He was wanting to know if you could call the pharmacy and give a verbal order for the prescription. The number is [phone] for [facility]. He stated if you can't do that, could you mail out an actual prescription. Thank you!

## 2022-05-22 NOTE — Telephone Encounter (Signed)
Spoke with patient and advised him that I have tried faxing rx several times and calling the pharmacy with no call backs. Faxes will not go thru either. The form I sent him is the form that was sent to [place] to fill out and sign by the pharmacy. I advised patient that they should take that since it was filled out and signed by Dr. Para March.

## 2022-05-22 NOTE — Telephone Encounter (Signed)
Tried calling the pharmacy twice but VM just picks up. I left a message for the pharmacy to please call me back to discuss rx. I have also left patient a message to call back.

## 2022-07-11 ENCOUNTER — Other Ambulatory Visit: Payer: Self-pay | Admitting: Family Medicine

## 2022-07-22 ENCOUNTER — Encounter: Payer: Self-pay | Admitting: Family Medicine

## 2022-07-22 ENCOUNTER — Ambulatory Visit (INDEPENDENT_AMBULATORY_CARE_PROVIDER_SITE_OTHER): Payer: BLUE CROSS/BLUE SHIELD | Admitting: Family Medicine

## 2022-07-22 VITALS — BP 120/70 | HR 87 | Temp 97.0°F | Ht 68.0 in | Wt 195.0 lb

## 2022-07-22 DIAGNOSIS — Z0000 Encounter for general adult medical examination without abnormal findings: Secondary | ICD-10-CM

## 2022-07-22 DIAGNOSIS — E785 Hyperlipidemia, unspecified: Secondary | ICD-10-CM

## 2022-07-22 DIAGNOSIS — Z7189 Other specified counseling: Secondary | ICD-10-CM

## 2022-07-22 DIAGNOSIS — F429 Obsessive-compulsive disorder, unspecified: Secondary | ICD-10-CM

## 2022-07-22 DIAGNOSIS — Z79899 Other long term (current) drug therapy: Secondary | ICD-10-CM | POA: Diagnosis not present

## 2022-07-22 DIAGNOSIS — R61 Generalized hyperhidrosis: Secondary | ICD-10-CM

## 2022-07-22 MED ORDER — CLOMIPRAMINE HCL 50 MG PO CAPS: ORAL_CAPSULE | ORAL | 3 refills | Status: AC

## 2022-07-22 MED ORDER — VENLAFAXINE HCL ER 75 MG PO CP24: 75.0000 mg | ORAL_CAPSULE | Freq: Every day | ORAL | 3 refills | Status: DC

## 2022-07-22 MED ORDER — DRYSOL 20 % EX SOLN: CUTANEOUS | 3 refills | Status: AC

## 2022-07-22 NOTE — Patient Instructions (Addendum)
Check your records.  If due for a Tdap, you could get it at the pharmacy.    2nd shingles shot when possible.   Go to the lab on the way out.   If you have mychart we'll likely use that to update you.     Take care.  Glad to see you.

## 2022-07-22 NOTE — Progress Notes (Unsigned)
CPE- See plan.  Routine anticipatory guidance given to patient.  See health maintenance.  The possibility exists that previously documented standard health maintenance information may have been brought forward from a previous encounter into this note.  If needed, that same information has been updated to reflect the current situation based on today's encounter.    Tetanus 2013 Flu shot usually yearly.   PNA not due.  Shingles 1st dose 2022  covid vaccine prev done.   Prostate cancer screening and PSA options (with potential risks and benefits of testing vs not testing) were discussed along with recent recs/guidelines.  He declined testing PSA at this point. cologuard negative 2023.   Living will. Would have [facility] [phone], [phone]) designated if patient were incapacitated.   Diet and exercise d/w pt.  Encouraged work on both.    HLD.  Recheck labs pending.  Jogging some.  Diet and exercise d/w pt.     Mood d/w pt.  Still on venlafaxine and clomipramine at baseline.  He is working as a [employer] manager.  No SI/HI. Some anxiety at work, when trying to complete tasks on a deadline.  Stressors d/w pt, esp with politics and world events.     D/w pt about using drysol prn.  Rx sent.  It worked prev for hyperhydrosis.    PMH and SH reviewed  Meds, vitals, and allergies reviewed.   ROS: Per HPI.  Unless specifically indicated otherwise in HPI, the patient denies:  General: fever. Eyes: acute vision changes ENT: sore throat Cardiovascular: chest pain Respiratory: SOB GI: vomiting GU: dysuria Musculoskeletal: acute back pain Derm: acute rash Neuro: acute motor dysfunction Psych: worsening mood Endocrine: polydipsia Heme: bleeding Allergy: hayfever  GEN: nad, alert and oriented HEENT: mucous membranes moist NECK: supple w/o LA CV: rrr. PULM: ctab, no inc wob ABD: soft, +bs EXT: no edema SKIN: no acute rash

## 2022-07-23 LAB — CBC WITH DIFFERENTIAL/PLATELET
Basophils Absolute: 0 10*3/uL (ref 0.0–0.1)
Basophils Relative: 0.4 % (ref 0.0–3.0)
Eosinophils Absolute: 0.1 10*3/uL (ref 0.0–0.7)
Eosinophils Relative: 0.7 % (ref 0.0–5.0)
HCT: 42.1 % (ref 39.0–52.0)
Hemoglobin: 14.6 g/dL (ref 13.0–17.0)
Lymphocytes Relative: 18.9 % (ref 12.0–46.0)
Lymphs Abs: 1.5 10*3/uL (ref 0.7–4.0)
MCHC: 34.7 g/dL (ref 30.0–36.0)
MCV: 81.6 fl (ref 78.0–100.0)
Monocytes Absolute: 0.5 10*3/uL (ref 0.1–1.0)
Monocytes Relative: 6.6 % (ref 3.0–12.0)
Neutro Abs: 5.7 10*3/uL (ref 1.4–7.7)
Neutrophils Relative %: 73.4 % (ref 43.0–77.0)
Platelets: 304 10*3/uL (ref 150.0–400.0)
RBC: 5.16 Mil/uL (ref 4.22–5.81)
RDW: 13.3 % (ref 11.5–15.5)
WBC: 7.8 10*3/uL (ref 4.0–10.5)

## 2022-07-23 LAB — TSH: TSH: 1.68 u[IU]/mL (ref 0.35–5.50)

## 2022-07-23 LAB — COMPREHENSIVE METABOLIC PANEL
ALT: 14 U/L (ref 0–53)
AST: 17 U/L (ref 0–37)
Albumin: 4.6 g/dL (ref 3.5–5.2)
Alkaline Phosphatase: 78 U/L (ref 39–117)
BUN: 17 mg/dL (ref 6–23)
CO2: 20 mEq/L (ref 19–32)
Calcium: 9.2 mg/dL (ref 8.4–10.5)
Chloride: 102 mEq/L (ref 96–112)
Creatinine, Ser: 0.9 mg/dL (ref 0.40–1.50)
GFR: 96.91 mL/min (ref >60.00)
Glucose, Bld: 88 mg/dL (ref 70–99)
Potassium: 4.1 mEq/L (ref 3.5–5.1)
Sodium: 135 mEq/L (ref 135–145)
Total Bilirubin: 0.5 mg/dL (ref 0.2–1.2)
Total Protein: 7.5 g/dL (ref 6.0–8.3)

## 2022-07-23 LAB — LIPID PANEL
Cholesterol: 202 mg/dL — ABNORMAL HIGH (ref 0–200)
HDL: 48.8 mg/dL (ref >39.00)
LDL Cholesterol: 131 mg/dL — ABNORMAL HIGH (ref 0–99)
NonHDL: 152.76
Total CHOL/HDL Ratio: 4
Triglycerides: 110 mg/dL (ref 0.0–149.0)
VLDL: 22 mg/dL (ref 0.0–40.0)

## 2022-07-24 NOTE — Assessment & Plan Note (Signed)
Continue as needed use of Drysol.

## 2022-07-24 NOTE — Assessment & Plan Note (Signed)
Continue clomipramine and venlafaxine.  Okay for outpatient follow-up.  No suicidal or homicidal intent.  See notes on labs.

## 2022-07-24 NOTE — Assessment & Plan Note (Signed)
Living will. Would have [facility] [phone], [phone]) designated if patient were incapacitated.

## 2022-07-24 NOTE — Assessment & Plan Note (Signed)
Recheck labs pending.  Jogging some.  Diet and exercise d/w pt.

## 2022-07-24 NOTE — Assessment & Plan Note (Signed)
Tetanus 2013 Flu shot usually yearly.   PNA not due.  Shingles 1st dose 2022  covid vaccine prev done.   Prostate cancer screening and PSA options (with potential risks and benefits of testing vs not testing) were discussed along with recent recs/guidelines.  He declined testing PSA at this point. cologuard negative 2023.   Living will. Would have [facility] [phone], [phone]) designated if patient were incapacitated.   Diet and exercise d/w pt.  Encouraged work on both.

## 2022-07-25 LAB — CLOMIPRAMINE AND METABOLITE, SERUM
Clomipramine Lvl: 101 ng/mL (ref 70–200)
Norclomipramine,S: 121 ng/mL — ABNORMAL LOW (ref 150–300)
Total (Clo+Norclo): 222 ng/mL (ref 220–500)

## 2023-03-04 DIAGNOSIS — L258 Unspecified contact dermatitis due to other agents: Secondary | ICD-10-CM | POA: Diagnosis not present

## 2023-07-25 ENCOUNTER — Encounter: Payer: BLUE CROSS/BLUE SHIELD | Admitting: Family Medicine

## 2023-09-29 ENCOUNTER — Other Ambulatory Visit: Payer: Self-pay | Admitting: Family Medicine

## 2023-09-29 NOTE — Telephone Encounter (Signed)
 Copied from CRM [id number]. Topic: Clinical - Medication Refill >> Sep 29, 2023  4:33 PM Bambi Bonine D wrote: Most Recent Primary Care Visit:  Provider: Donnie Galea  Department: [facility]  Visit Type: PHYSICAL  Date: 07/22/2022  Medication: venlafaxine XR (EFFEXOR-XR) 75 MG 24 hr capsule  Has the patient contacted their pharmacy? Yes (Agent: If no, request that the patient contact the pharmacy for the refill. If patient does not wish to contact the pharmacy document the reason why and proceed with request.) (Agent: If yes, when and what did the pharmacy advise?)  Is this the correct pharmacy for this prescription? Yes If no, delete pharmacy and type the correct one.  This is the patient's preferred pharmacy:   [id number] [place], [place] - [address] [place] [address] Phone: [phone] Fax: [phone]   Has the prescription been filled recently? No  Is the patient out of the medication? No  Has the patient been seen for an appointment in the last year OR does the patient have an upcoming appointment? Yes  Can we respond through MyChart? Yes  Agent: Please be advised that Rx refills may take up to 3 business days. We ask that you follow-up with your pharmacy.

## 2023-10-01 NOTE — Telephone Encounter (Signed)
 LOV: 07/22/22 NOV: 11/27/23 LAST REFILL: venlafaxine XR (EFFEXOR-XR) 75 MG 24 hr capsule 07/22/22 90 3 REFILLS

## 2023-10-02 MED ORDER — VENLAFAXINE HCL ER 75 MG PO CP24: 75.0000 mg | ORAL_CAPSULE | Freq: Every day | ORAL | 0 refills | Status: DC

## 2023-10-02 NOTE — Telephone Encounter (Signed)
 ERx

## 2023-10-12 ENCOUNTER — Other Ambulatory Visit: Payer: Self-pay | Admitting: Family Medicine

## 2023-10-31 ENCOUNTER — Telehealth: Payer: Self-pay

## 2023-10-31 MED ORDER — VENLAFAXINE HCL ER 75 MG PO CP24: 75.0000 mg | ORAL_CAPSULE | Freq: Every day | ORAL | 0 refills | Status: AC

## 2023-10-31 NOTE — Addendum Note (Signed)
 Addended by: Donnie Galea on: 10/31/2023 01:54 PM   Modules accepted: Orders

## 2023-10-31 NOTE — Telephone Encounter (Signed)
 Sent. Thanks.

## 2023-10-31 NOTE — Telephone Encounter (Signed)
 Copied from CRM [id number]. Topic: Clinical - Prescription Issue >> Oct 29, 2023  4:25 PM Adonis Hoot wrote: Reason for CRM: Patient called in stating that  he never received  prescription for venlafaxine  XR (EFFEXOR -XR) 75 MG 24 hr capsule that was sent through home delivery in [place]. He would like to know if he could have prescription sent to   [id number] [place], [place] - [address]  Phone: [phone] Fax: [phone]

## 2023-11-27 ENCOUNTER — Encounter: Admitting: Family Medicine

## 2024-01-29 ENCOUNTER — Other Ambulatory Visit: Payer: Self-pay | Admitting: Family Medicine
